# Patient Record
Sex: Female | Born: 1955 | Race: White | Hispanic: No | Marital: Single | Smoking: Never smoker
Health system: Southern US, Community
[De-identification: ages and names within clinical notes are randomized; demographics above are authoritative.]

## PROBLEM LIST (undated history)

## (undated) DIAGNOSIS — K219 Gastro-esophageal reflux disease without esophagitis: Secondary | ICD-10-CM

## (undated) DIAGNOSIS — J45909 Unspecified asthma, uncomplicated: Secondary | ICD-10-CM

## (undated) DIAGNOSIS — M1990 Unspecified osteoarthritis, unspecified site: Secondary | ICD-10-CM

## (undated) HISTORY — PX: OTHER SURGICAL HISTORY: SHX169

## (undated) HISTORY — PX: TONSILLECTOMY: SUR1361

## (undated) HISTORY — PX: APPENDECTOMY: SHX54

## (undated) HISTORY — PX: ANKLE FRACTURE SURGERY: SHX122

## (undated) HISTORY — PX: BILATERAL TEMPOROMANDIBULAR JOINT ARTHROPLASTY: SUR77

---

## 2005-04-16 ENCOUNTER — Emergency Department (HOSPITAL_COMMUNITY): Admission: EM | Admit: 2005-04-16 | Discharge: 2005-04-16 | Payer: Self-pay | Admitting: Emergency Medicine

## 2005-12-07 ENCOUNTER — Ambulatory Visit (HOSPITAL_COMMUNITY): Admission: RE | Admit: 2005-12-07 | Discharge: 2005-12-07 | Payer: Self-pay | Admitting: Obstetrics and Gynecology

## 2006-04-04 ENCOUNTER — Emergency Department (HOSPITAL_COMMUNITY): Admission: EM | Admit: 2006-04-04 | Discharge: 2006-04-04 | Payer: Self-pay | Admitting: Emergency Medicine

## 2006-08-05 ENCOUNTER — Emergency Department (HOSPITAL_COMMUNITY): Admission: EM | Admit: 2006-08-05 | Discharge: 2006-08-05 | Payer: Self-pay | Admitting: Emergency Medicine

## 2006-12-13 ENCOUNTER — Ambulatory Visit (HOSPITAL_COMMUNITY): Admission: RE | Admit: 2006-12-13 | Discharge: 2006-12-13 | Payer: Self-pay | Admitting: Family Medicine

## 2008-01-10 ENCOUNTER — Emergency Department (HOSPITAL_COMMUNITY): Admission: EM | Admit: 2008-01-10 | Discharge: 2008-01-10 | Payer: Self-pay | Admitting: Emergency Medicine

## 2008-01-20 ENCOUNTER — Ambulatory Visit (HOSPITAL_COMMUNITY): Admission: RE | Admit: 2008-01-20 | Discharge: 2008-01-20 | Payer: Self-pay | Admitting: Obstetrics & Gynecology

## 2008-06-05 IMAGING — CR DG CHEST 2V
2 series · 2 of 2 positions shown · non-contrast
Comparison: none

CLINICAL DATA: Left upper chest and back pain.  Shortness of breath.  History of spontaneous pneumothorax approximately 13 years ago.
CHEST ? 2 VIEW:

[view not recorded (1 of 2)]
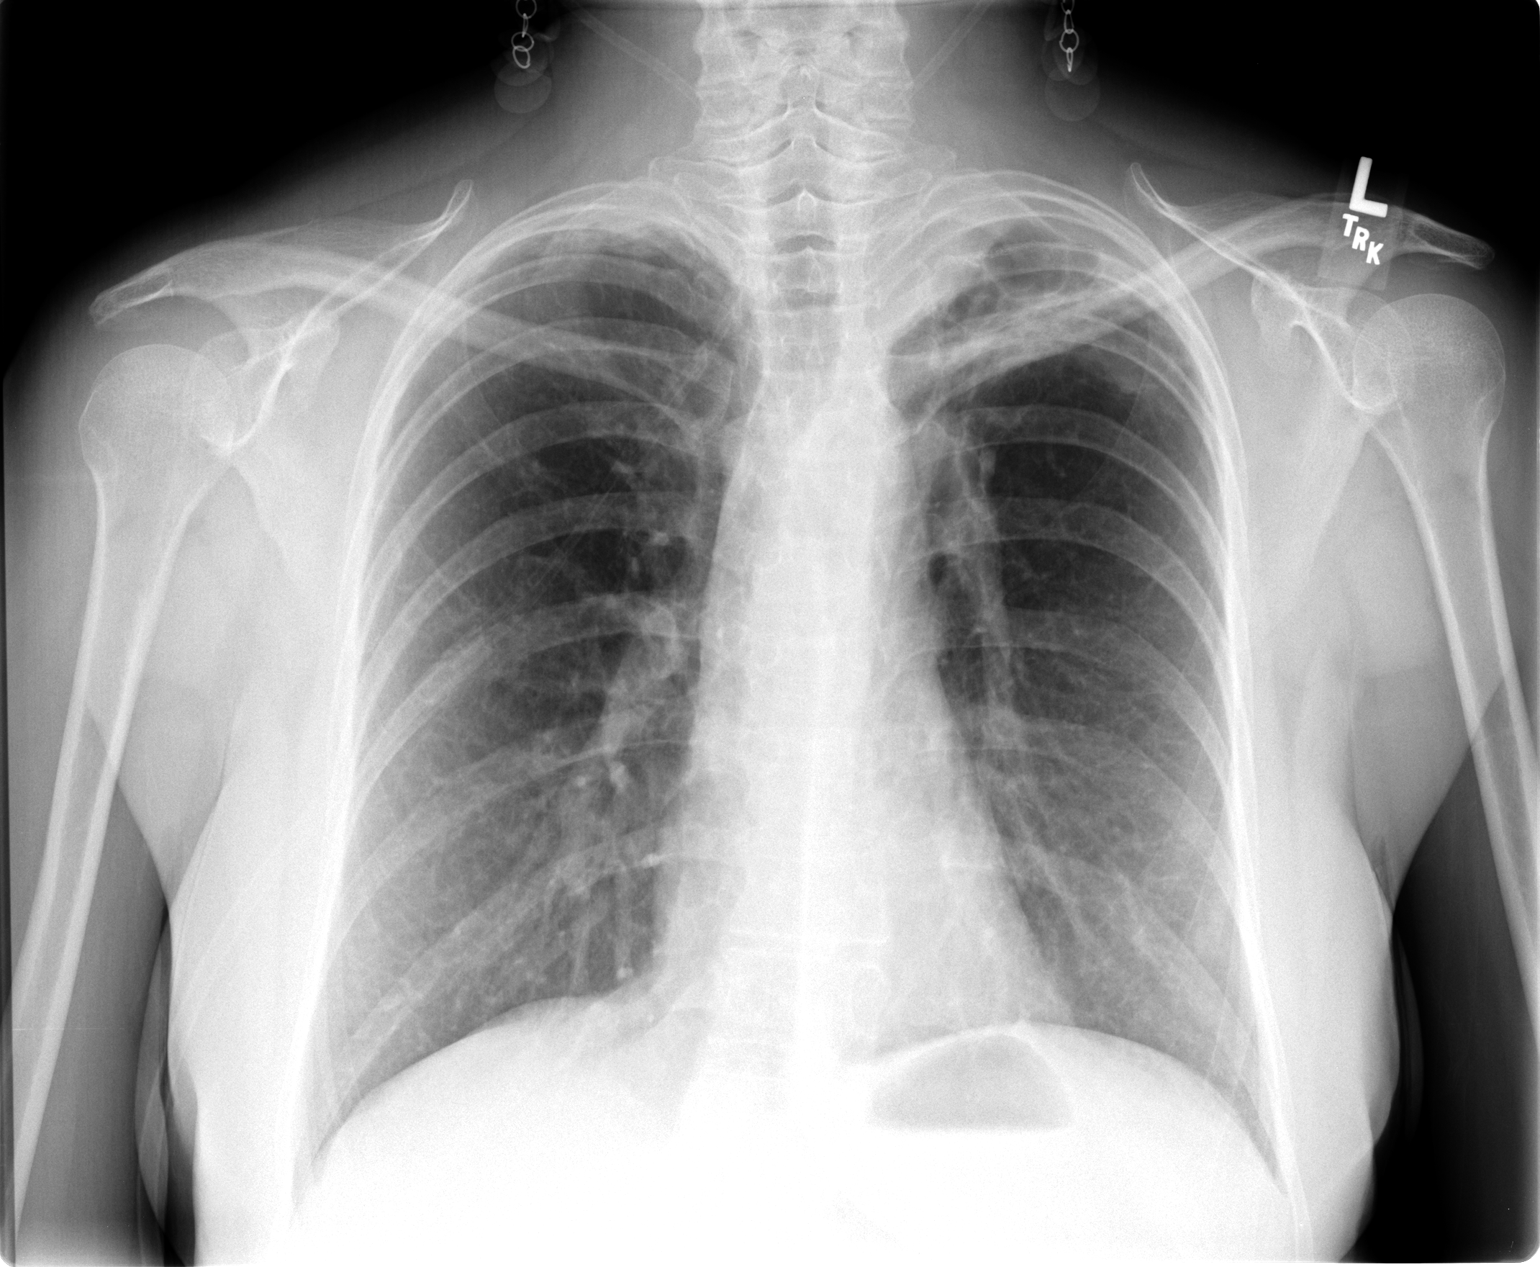

[view not recorded (2 of 2)]
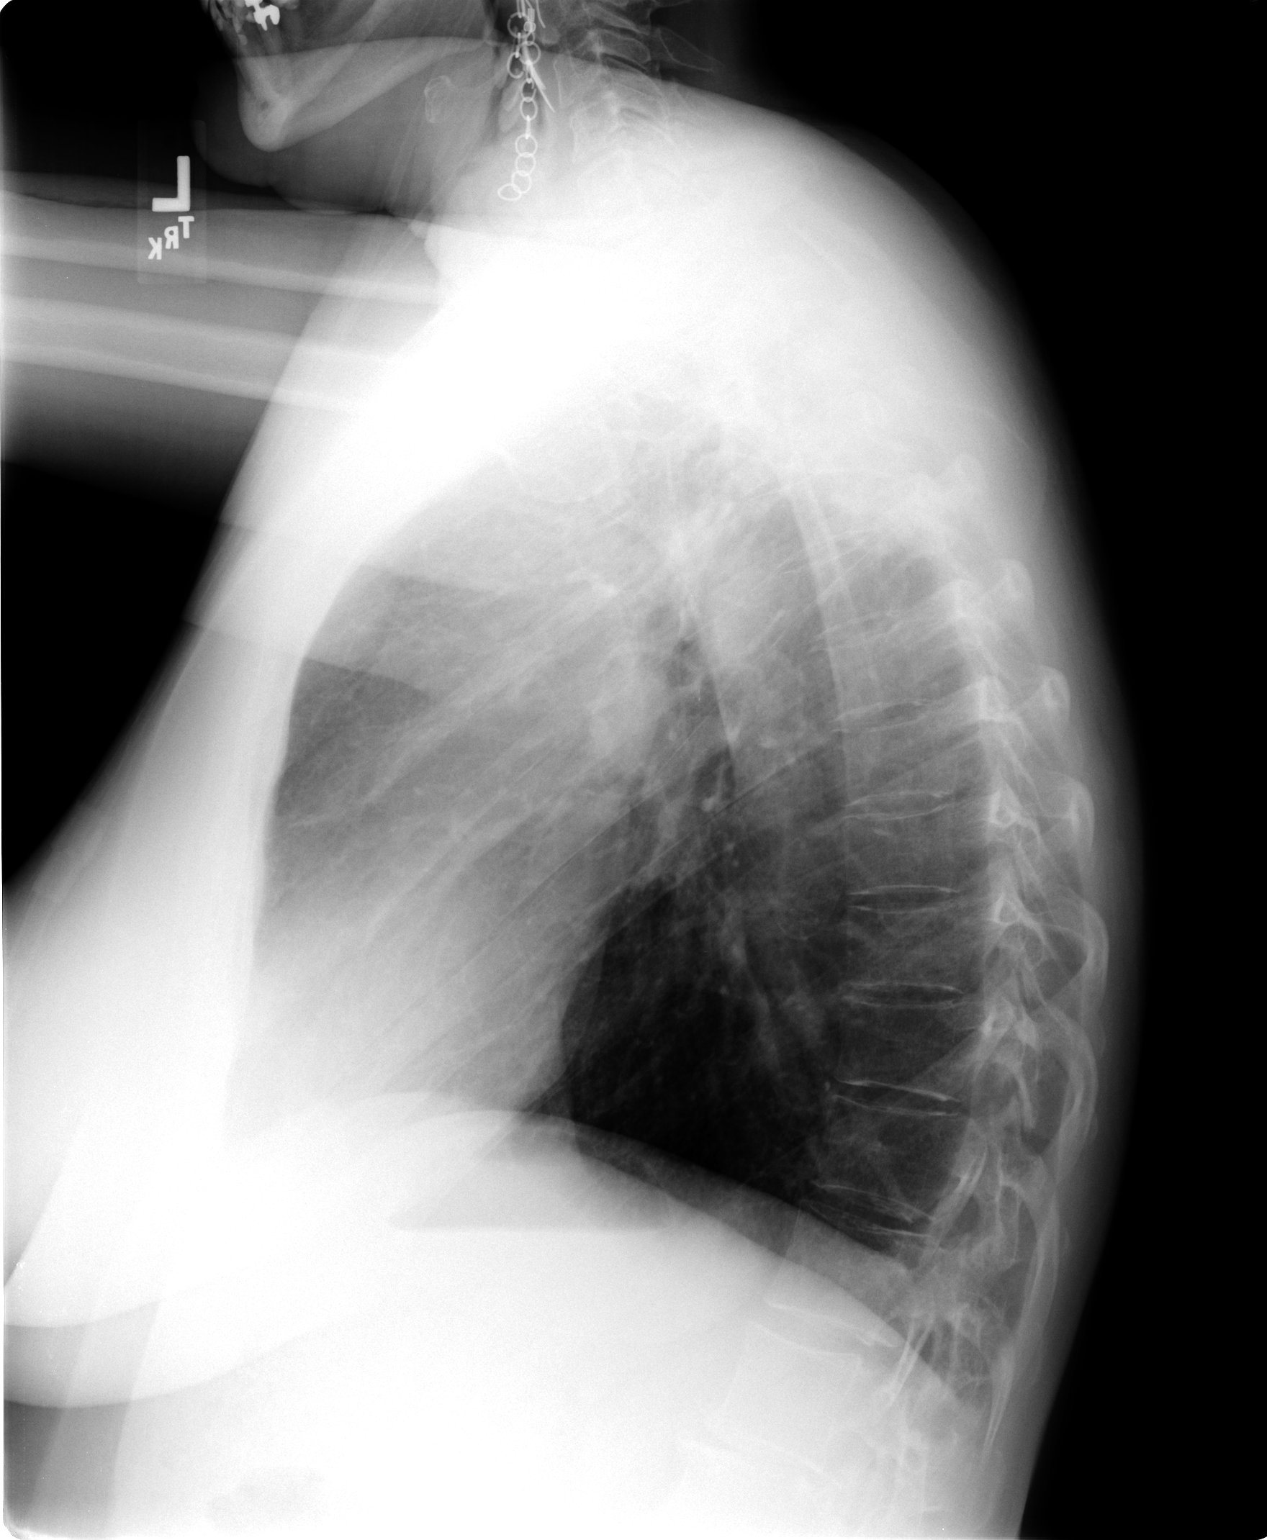

[2 of 2 positions shown; findings below may reference images not displayed]

FINDINGS: The patient has biapical pleuroparenchymal scarring, worse on the left.  There is a small cystic cavity in the left upper lobe with suture material identified.  This likely relates to the patient?s history of prior pneumothorax.  Lungs are otherwise clear.  No effusion or focal bony abnormality.  Heart size is normal.
IMPRESSION: No acute finding.  Biapical pleuroparenchymal scarring, worse on the left as noted.  As above, this likely relates to history of pneumothorax.

## 2008-08-15 ENCOUNTER — Emergency Department (HOSPITAL_COMMUNITY): Admission: EM | Admit: 2008-08-15 | Discharge: 2008-08-15 | Payer: Self-pay | Admitting: Emergency Medicine

## 2009-10-13 ENCOUNTER — Emergency Department (HOSPITAL_COMMUNITY): Admission: EM | Admit: 2009-10-13 | Discharge: 2009-10-13 | Payer: Self-pay | Admitting: Emergency Medicine

## 2010-10-25 LAB — CBC
HCT: 42.6 % (ref 36.0–46.0)
MCHC: 35.3 g/dL (ref 30.0–36.0)
RBC: 4.3 MIL/uL (ref 3.87–5.11)
RDW: 13 % (ref 11.5–15.5)
WBC: 7.7 10*3/uL (ref 4.0–10.5)

## 2010-10-25 LAB — BASIC METABOLIC PANEL
BUN: 10 mg/dL (ref 6–23)
Calcium: 9.9 mg/dL (ref 8.4–10.5)
Chloride: 100 mEq/L (ref 96–112)
Creatinine, Ser: 1.07 mg/dL (ref 0.4–1.2)
GFR calc non Af Amer: 54 mL/min — ABNORMAL LOW (ref >60)
Sodium: 140 mEq/L (ref 135–145)

## 2010-10-25 LAB — POCT CARDIAC MARKERS

## 2010-10-25 LAB — DIFFERENTIAL
Eosinophils Relative: 3 % (ref 0–5)
Monocytes Absolute: 0.7 10*3/uL (ref 0.1–1.0)
Neutro Abs: 4.9 10*3/uL (ref 1.7–7.7)

## 2012-01-09 ENCOUNTER — Other Ambulatory Visit (HOSPITAL_BASED_OUTPATIENT_CLINIC_OR_DEPARTMENT_OTHER): Payer: Self-pay | Admitting: Radiology

## 2012-01-09 ENCOUNTER — Other Ambulatory Visit: Payer: Self-pay | Admitting: Geriatric Medicine

## 2012-01-09 DIAGNOSIS — Z1231 Encounter for screening mammogram for malignant neoplasm of breast: Secondary | ICD-10-CM

## 2012-01-17 ENCOUNTER — Ambulatory Visit
Admission: RE | Admit: 2012-01-17 | Discharge: 2012-01-17 | Disposition: A | Discharge status: Home / Self Care | Payer: No Typology Code available for payment source | Source: Ambulatory Visit | Attending: Geriatric Medicine | Admitting: Geriatric Medicine

## 2012-01-17 DIAGNOSIS — Z1231 Encounter for screening mammogram for malignant neoplasm of breast: Secondary | ICD-10-CM

## 2013-01-16 ENCOUNTER — Emergency Department (HOSPITAL_COMMUNITY): Payer: Self-pay

## 2013-01-16 ENCOUNTER — Encounter (HOSPITAL_COMMUNITY): Payer: Self-pay | Admitting: Cardiology

## 2013-01-16 ENCOUNTER — Emergency Department (HOSPITAL_COMMUNITY)
Admission: EM | Admit: 2013-01-16 | Discharge: 2013-01-16 | Disposition: A | Discharge status: Home / Self Care | Payer: Self-pay | Attending: Emergency Medicine | Admitting: Emergency Medicine

## 2013-01-16 DIAGNOSIS — R071 Chest pain on breathing: Secondary | ICD-10-CM | POA: Insufficient documentation

## 2013-01-16 DIAGNOSIS — R079 Chest pain, unspecified: Secondary | ICD-10-CM

## 2013-01-16 DIAGNOSIS — R0602 Shortness of breath: Secondary | ICD-10-CM | POA: Insufficient documentation

## 2013-01-16 LAB — BASIC METABOLIC PANEL
BUN: 11 mg/dL (ref 6–23)
Calcium: 9.8 mg/dL (ref 8.4–10.5)
Chloride: 102 mEq/L (ref 96–112)
Creatinine, Ser: 0.72 mg/dL (ref 0.50–1.10)
GFR calc Af Amer: >90 mL/min (ref >90)
Glucose, Bld: 118 mg/dL — ABNORMAL HIGH (ref 70–99)
Potassium: 3.7 mEq/L (ref 3.5–5.1)

## 2013-01-16 LAB — CBC
Hemoglobin: 14.5 g/dL (ref 12.0–15.0)
MCHC: 34.8 g/dL (ref 30.0–36.0)
MCV: 97.4 fL (ref 78.0–100.0)
WBC: 8.9 10*3/uL (ref 4.0–10.5)

## 2013-01-16 MED ORDER — ONDANSETRON HCL 4 MG/2ML IJ SOLN
4.0000 mg | Freq: Once | INTRAMUSCULAR | Status: AC
  Administered 2013-01-16: 4 mg via INTRAVENOUS
  Filled 2013-01-16: qty 2

## 2013-01-16 MED ORDER — HYDROCHLOROTHIAZIDE 12.5 MG PO TABS: 25.0000 mg | ORAL_TABLET | Freq: Every day | ORAL | Status: DC

## 2013-01-16 MED ORDER — HYDROMORPHONE HCL PF 1 MG/ML IJ SOLN
1.0000 mg | Freq: Once | INTRAMUSCULAR | Status: AC
  Administered 2013-01-16: 1 mg via INTRAVENOUS
  Filled 2013-01-16: qty 1

## 2013-01-16 MED ORDER — IOHEXOL 350 MG/ML SOLN: 100.0000 mL | Freq: Once | INTRAVENOUS | Status: DC | PRN

## 2013-01-16 MED ORDER — OXYCODONE-ACETAMINOPHEN 5-325 MG PO TABS: 1.0000 | ORAL_TABLET | ORAL | Status: DC | PRN

## 2013-01-16 MED ORDER — IOHEXOL 350 MG/ML SOLN
100.0000 mL | Freq: Once | INTRAVENOUS | Status: AC | PRN
  Administered 2013-01-16: 60 mL via INTRAVENOUS

## 2013-01-16 MED ORDER — MORPHINE SULFATE 4 MG/ML IJ SOLN
4.0000 mg | Freq: Once | INTRAMUSCULAR | Status: AC
  Administered 2013-01-16: 4 mg via INTRAVENOUS
  Filled 2013-01-16: qty 1

## 2013-01-16 NOTE — ED Notes (Signed)
IV team paged.  

## 2013-01-16 NOTE — ED Notes (Signed)
Pt back from radiology, pt reports IV blew and was unable to have the CT angio performed. Pt requests IV team be contacted.

## 2013-01-16 NOTE — ED Notes (Signed)
Iv team called and said they will be here shortly.

## 2013-01-16 NOTE — ED Provider Notes (Signed)
History    CSN: [id number] Arrival date & time 01/16/13  1314  First MD Initiated Contact with Patient 01/16/13 1403     Chief Complaint  Patient presents with  . Chest Pain   (Consider location/radiation/quality/duration/timing/severity/associated sxs/prior Treatment) HPI Comments: Patient is a 57 year old female with a past medical history of previous PE, previous pneumothorax, and previous thoracentesis who presents with a 6 day history of left sided chest pain. Symptoms started gradually and progressively worsened since the onset. The pain is sharp and severe and radiates down the left side of her chest. She reports associated SOB. Patient came to the ED today because the pain became unbearable. No aggravating/alleviating factors. No other associated symptoms.   History reviewed. No pertinent past medical history. Past Surgical History  Procedure Laterality Date  . Fluid removed from lung     History reviewed. No pertinent family history. History  Substance Use Topics  . Smoking status: Never Smoker   . Smokeless tobacco: Not on file  . Alcohol Use: Yes   OB History   Grav Para Term Preterm Abortions TAB SAB Ect Mult Living                 Review of Systems  Respiratory: Positive for shortness of breath.   Cardiovascular: Positive for chest pain.  All other systems reviewed and are negative.    Allergies  Review of patient's allergies indicates no known allergies.  Home Medications  No current outpatient prescriptions on file. BP 139/81  Pulse 109  Temp(Src) 98.3 F (36.8 C) (Oral)  Resp 20 Physical Exam  Nursing note and vitals reviewed. Constitutional: She is oriented to person, place, and time. She appears well-developed and well-nourished.  Patient uncomfortable.   HENT:  Head: Normocephalic and atraumatic.  Eyes: Conjunctivae and EOM are normal.  Neck: Normal range of motion.  Cardiovascular: Normal rate and regular rhythm.  Exam reveals no gallop  and no friction rub.   No murmur heard. Pulmonary/Chest: Effort normal and breath sounds normal. She has no wheezes. She has no rales. She exhibits no tenderness.  Abdominal: Soft. She exhibits no distension. There is no tenderness. There is no rebound and no guarding.  Musculoskeletal: Normal range of motion.  Neurological: She is alert and oriented to person, place, and time. Coordination normal.  Speech is goal-oriented. Moves limbs without ataxia.   Skin: Skin is warm and dry.  Psychiatric: She has a normal mood and affect. Her behavior is normal.    ED Course  Procedures (including critical care time)   Date: 01/16/2013  Rate: 107  Rhythm: sinus tachycardia  QRS Axis: normal  Intervals: normal  ST/T Wave abnormalities: normal  Conduction Disutrbances:none  Narrative Interpretation: sinus tachycardia without previous for comparison  Old EKG Reviewed: none available    Labs Reviewed  BASIC METABOLIC PANEL - Abnormal; Notable for the following:    Glucose, Bld 118 (*)    All other components within normal limits  PRO B NATRIURETIC PEPTIDE - Abnormal; Notable for the following:    Pro B Natriuretic peptide (BNP) 224.0 (*)    All other components within normal limits  CBC  POCT I-STAT TROPONIN I   Dg Chest 2 View  01/16/2013   *RADIOLOGY REPORT*  Clinical Data: Chest pain  CHEST - 2 VIEW  Comparison: August 15, 2008  Findings:  There is stable fibrotic change with scarring in both apices, considerably more on the left than on the right.  Overall there  is no edema or consolidation.  Heart size is normal.  The pulmonary vascularity is stable with some retraction superiorly of the pulmonary vasculature on the left due to the scarring.  No adenopathy.  No bone lesions.  IMPRESSION: Stable scarring in the apices, considerably more on the left than on the right.  No edema or consolidation.   Original Report Authenticated By: Bretta Bang, M.D.   Ct Angio Chest Pe W/cm &/or Wo  Cm  01/16/2013   *RADIOLOGY REPORT*  Clinical Data: Left chest pain with increase shortness of breath. Question pulmonary embolism.  History of pneumothorax.  CT ANGIOGRAPHY CHEST  Technique:  Multidetector CT imaging of the chest using the standard protocol during bolus administration of intravenous contrast. Multiplanar reconstructed images including MIPs were obtained and reviewed to evaluate the vascular anatomy.  Contrast: 60mL OMNIPAQUE IOHEXOL 350 MG/ML SOLN  Comparison: Chest radiographs 01/16/2013 and 08/15/2008.  No prior CT.  Findings: The pulmonary arteries are well opacified with contrast. There is no evidence of acute pulmonary embolism.  No significant atherosclerosis is demonstrated.  There are no enlarged mediastinal or hilar lymph nodes.  There is no pleural or pericardial effusion.  Left apical pleural thickening appears grossly unchanged from the prior radiographs.  There are surgical clips at the left apex with bronchiectasis and a 2.8 cm apical bleb or chronic loculated pneumothorax. Mild right apical scarring is present.  There is no confluent airspace opacity.  The visualized upper abdomen is notable for scarring in the upper pole of the right kidney adjacent to a calyceal calculus.  There are no acute osseous findings.  IMPRESSION:  1.  No evidence of acute pulmonary embolism or other acute chest process. 2.  Left apical pleural thickening with adjacent scarring, bronchiectasis and bleb or loculated pneumothorax, unchanged from prior radiographs.   Original Report Authenticated By: Carey Bullocks, M.D.   1. Chest pain     MDM  2:29 PM Labs pending. Chest xray unremarkable for acute changes. Patient tachycardic. Patient will have morphine and zofran for symptoms.   3:11 PM Patient will have Chest CT to rule out PE. Patient signed out to Sharilyn Sites, PA-C pending disposition.     Emilia Beck, PA-C 01/17/13 1109

## 2013-01-16 NOTE — ED Notes (Signed)
PA at bedside.

## 2013-01-16 NOTE — ED Notes (Signed)
CT called and informed pt has IV access.  

## 2013-01-16 NOTE — ED Notes (Signed)
Pt reports hx of a pneumothrorax about 2 years ago. Reports that since Friday she has been having pain on the left side. Reports increased SOB. Pt able to speak in complete sentences and breath sounds noted bilaterally.

## 2013-01-16 NOTE — ED Notes (Signed)
IV team at bedside 

## 2013-01-16 NOTE — ED Provider Notes (Signed)
Pt received from PA Szekalski at shift change.  Pt complaining of progressively worsening, left sided chest pain x 6 days with associated SOB.   Hx of PE and pneumothorax.  Pt awaiting CT angio to r/o PE.  Plan:  Follow results of scan, if negative-- d/c home.  Results for orders placed during the hospital encounter of 01/16/13  CBC      Result Value Range   WBC 8.9  4.0 - 10.5 K/uL   RBC 4.28  3.87 - 5.11 MIL/uL   Hemoglobin 14.5  12.0 - 15.0 g/dL   HCT 04.5  40.9 - 81.1 %   MCV 97.4  78.0 - 100.0 fL   MCH 33.9  26.0 - 34.0 pg   MCHC 34.8  30.0 - 36.0 g/dL   RDW 91.4  78.2 - 95.6 %   Platelets 221  150 - 400 K/uL  BASIC METABOLIC PANEL      Result Value Range   Sodium 140  135 - 145 mEq/L   Potassium 3.7  3.5 - 5.1 mEq/L   Chloride 102  96 - 112 mEq/L   CO2 22  19 - 32 mEq/L   Glucose, Bld 118 (*) 70 - 99 mg/dL   BUN 11  6 - 23 mg/dL   Creatinine, Ser 2.13  0.50 - 1.10 mg/dL   Calcium 9.8  8.4 - 08.6 mg/dL   GFR calc non Af Amer >90  >90 mL/min   GFR calc Af Amer >90  >90 mL/min  PRO B NATRIURETIC PEPTIDE      Result Value Range   Pro B Natriuretic peptide (BNP) 224.0 (*) 0 - 125 pg/mL  POCT I-STAT TROPONIN I      Result Value Range   Troponin i, poc 0.00  0.00 - 0.08 ng/mL   Comment 3            Dg Chest 2 View  01/16/2013   *RADIOLOGY REPORT*  Clinical Data: Chest pain  CHEST - 2 VIEW  Comparison: August 15, 2008  Findings:  There is stable fibrotic change with scarring in both apices, considerably more on the left than on the right.  Overall there is no edema or consolidation.  Heart size is normal.  The pulmonary vascularity is stable with some retraction superiorly of the pulmonary vasculature on the left due to the scarring.  No adenopathy.  No bone lesions.  IMPRESSION: Stable scarring in the apices, considerably more on the left than on the right.  No edema or consolidation.   Original Report Authenticated By: Bretta Bang, M.D.   Ct Angio Chest Pe W/cm &/or Wo  Cm  01/16/2013   *RADIOLOGY REPORT*  Clinical Data: Left chest pain with increase shortness of breath. Question pulmonary embolism.  History of pneumothorax.  CT ANGIOGRAPHY CHEST  Technique:  Multidetector CT imaging of the chest using the standard protocol during bolus administration of intravenous contrast. Multiplanar reconstructed images including MIPs were obtained and reviewed to evaluate the vascular anatomy.  Contrast: 60mL OMNIPAQUE IOHEXOL 350 MG/ML SOLN  Comparison: Chest radiographs 01/16/2013 and 08/15/2008.  No prior CT.  Findings: The pulmonary arteries are well opacified with contrast. There is no evidence of acute pulmonary embolism.  No significant atherosclerosis is demonstrated.  There are no enlarged mediastinal or hilar lymph nodes.  There is no pleural or pericardial effusion.  Left apical pleural thickening appears grossly unchanged from the prior radiographs.  There are surgical clips at the left apex with bronchiectasis and a  2.8 cm apical bleb or chronic loculated pneumothorax. Mild right apical scarring is present.  There is no confluent airspace opacity.  The visualized upper abdomen is notable for scarring in the upper pole of the right kidney adjacent to a calyceal calculus.  There are no acute osseous findings.  IMPRESSION:  1.  No evidence of acute pulmonary embolism or other acute chest process. 2.  Left apical pleural thickening with adjacent scarring, bronchiectasis and bleb or loculated pneumothorax, unchanged from prior radiographs.   Original Report Authenticated By: Carey Bullocks, M.D.    6:04 PM CT angio negative for PE.  Other findings unchanged from previous.  Pt re-evaluated, pain is starting to return-- additional meds given.  Pt also notes that she has been retaining fluid over the week, weight increased by 4-5 lbs which resolved by taking some leftover HCTZ.  7:20 PM Pain resolved with IV dilaudid and zofran.  Pt does not have a PCP at this time-- resource  guide given and encouraged FU.  Also given contact info for [facility] cardiology for FU if sx do not resolve.  Rx HCTZ and percocet.  Discussed plan with pt, she agreed.  Return precautions advised.  Garlon Hatchet, PA-C 01/16/13 2337

## 2013-01-16 NOTE — ED Notes (Signed)
Pt requested to speak to PA, PA informed. PA at bedside.

## 2013-01-18 NOTE — ED Provider Notes (Signed)
Medical screening examination/treatment/procedure(s) were performed by non-physician practitioner and as supervising physician I was immediately available for consultation/collaboration.  Javonne Dorko R. Ericka Marcellus, MD 01/18/13 1401 

## 2013-01-19 NOTE — ED Provider Notes (Signed)
Medical screening examination/treatment/procedure(s) were performed by non-physician practitioner and as supervising physician I was immediately available for consultation/collaboration.   Charles B. Bernette Mayers, MD 01/19/13 [id number]

## 2013-09-17 ENCOUNTER — Emergency Department (HOSPITAL_COMMUNITY)
Admission: EM | Admit: 2013-09-17 | Discharge: 2013-09-17 | Disposition: A | Discharge status: Home / Self Care | Payer: No Typology Code available for payment source | Attending: Emergency Medicine | Admitting: Emergency Medicine

## 2013-09-17 ENCOUNTER — Encounter (HOSPITAL_COMMUNITY): Payer: Self-pay | Admitting: Emergency Medicine

## 2013-09-17 ENCOUNTER — Emergency Department (HOSPITAL_COMMUNITY): Payer: No Typology Code available for payment source

## 2013-09-17 DIAGNOSIS — R51 Headache: Secondary | ICD-10-CM | POA: Insufficient documentation

## 2013-09-17 DIAGNOSIS — R251 Tremor, unspecified: Secondary | ICD-10-CM

## 2013-09-17 DIAGNOSIS — R259 Unspecified abnormal involuntary movements: Secondary | ICD-10-CM | POA: Insufficient documentation

## 2013-09-17 DIAGNOSIS — Y929 Unspecified place or not applicable: Secondary | ICD-10-CM | POA: Insufficient documentation

## 2013-09-17 DIAGNOSIS — Y9389 Activity, other specified: Secondary | ICD-10-CM | POA: Insufficient documentation

## 2013-09-17 DIAGNOSIS — R209 Unspecified disturbances of skin sensation: Secondary | ICD-10-CM | POA: Insufficient documentation

## 2013-09-17 DIAGNOSIS — W010XXA Fall on same level from slipping, tripping and stumbling without subsequent striking against object, initial encounter: Secondary | ICD-10-CM | POA: Insufficient documentation

## 2013-09-17 DIAGNOSIS — M791 Myalgia, unspecified site: Secondary | ICD-10-CM

## 2013-09-17 DIAGNOSIS — S0990XA Unspecified injury of head, initial encounter: Secondary | ICD-10-CM

## 2013-09-17 LAB — CBC WITH DIFFERENTIAL/PLATELET
BASOS ABS: 0 10*3/uL (ref 0.0–0.1)
BASOS PCT: 1 % (ref 0–1)
Eosinophils Absolute: 0 10*3/uL (ref 0.0–0.7)
Eosinophils Relative: 1 % (ref 0–5)
HEMATOCRIT: 41.7 % (ref 36.0–46.0)
Hemoglobin: 14.5 g/dL (ref 12.0–15.0)
Lymphocytes Relative: 21 % (ref 12–46)
Lymphs Abs: 1.7 10*3/uL (ref 0.7–4.0)
MCH: 36 pg — ABNORMAL HIGH (ref 26.0–34.0)
MCHC: 34.8 g/dL (ref 30.0–36.0)
MCV: 103.5 fL — ABNORMAL HIGH (ref 78.0–100.0)
MONO ABS: 0.5 10*3/uL (ref 0.1–1.0)
Monocytes Relative: 6 % (ref 3–12)
NEUTROS ABS: 6.1 10*3/uL (ref 1.7–7.7)
NEUTROS PCT: 72 % (ref 43–77)
PLATELETS: 223 10*3/uL (ref 150–400)
RBC: 4.03 MIL/uL (ref 3.87–5.11)
RDW: 12.3 % (ref 11.5–15.5)
WBC: 8.5 10*3/uL (ref 4.0–10.5)

## 2013-09-17 LAB — I-STAT CHEM 8, ED
BUN: 9 mg/dL (ref 6–23)
CALCIUM ION: 1.08 mmol/L — AB (ref 1.12–1.23)
CHLORIDE: 102 meq/L (ref 96–112)
Creatinine, Ser: 0.7 mg/dL (ref 0.50–1.10)
Glucose, Bld: 87 mg/dL (ref 70–99)
HEMATOCRIT: 46 % (ref 36.0–46.0)
Hemoglobin: 15.6 g/dL — ABNORMAL HIGH (ref 12.0–15.0)
Potassium: 3.9 mEq/L (ref 3.7–5.3)
SODIUM: 137 meq/L (ref 137–147)
TCO2: 20 mmol/L (ref 0–100)

## 2013-09-17 LAB — CK: CK TOTAL: 69 U/L (ref 7–177)

## 2013-09-17 MED ORDER — LORAZEPAM 1 MG PO TABS
1.0000 mg | ORAL_TABLET | Freq: Once | ORAL | Status: AC
Start: 2013-09-17 — End: 2013-09-17
  Administered 2013-09-17: 1 mg via ORAL
  Filled 2013-09-17: qty 1

## 2013-09-17 MED ORDER — OXYCODONE-ACETAMINOPHEN 5-325 MG PO TABS
1.0000 | ORAL_TABLET | Freq: Once | ORAL | Status: AC
  Administered 2013-09-17: 1 via ORAL
  Filled 2013-09-17: qty 1

## 2013-09-17 MED ORDER — HYDROCODONE-ACETAMINOPHEN 5-325 MG PO TABS
1.0000 | ORAL_TABLET | ORAL | Status: DC | PRN
Start: 2013-09-17 — End: 2021-12-15

## 2013-09-17 NOTE — Discharge Instructions (Signed)
Your CT scan of the head and cervical spine did not show any acute injury. Your blood work was normal. Take ibuprofen for pain. Norco for severe pain. Follow up with a primary care doctor for further testing.    Emergency Department Resource Guide 1) Find a Doctor and Pay Out of Pocket Although you won't have to find out who is covered by your insurance plan, it is a good idea to ask around and get recommendations. You will then need to call the office and see if the doctor you have chosen will accept you as a new patient and what types of options they offer for patients who are self-pay. Some doctors offer discounts or will set up payment plans for their patients who do not have insurance, but you will need to ask so you aren't surprised when you get to your appointment.  2) Contact Your Local Health Department Not all health departments have doctors that can see patients for sick visits, but many do, so it is worth a call to see if yours does. If you don't know where your local health department is, you can check in your phone book. The CDC also has a tool to help you locate your state's health department, and many state websites also have listings of all of their local health departments.  3) Find a Walk-in Clinic If your illness is not likely to be very severe or complicated, you may want to try a walk in clinic. These are popping up all over the country in pharmacies, drugstores, and shopping centers. They're usually staffed by nurse practitioners or physician assistants that have been trained to treat common illnesses and complaints. They're usually fairly quick and inexpensive. However, if you have serious medical issues or chronic medical problems, these are probably not your best option.  No Primary Care Doctor: - Call Health Connect at  [phone] - they can help you locate a primary care doctor that  accepts your insurance, provides certain services, etc. - Physician Referral Service-  [phone]  Chronic Pain Problems: Organization         Address  Phone   Notes  [facility]  [phone] Patients need to be referred by their primary care doctor.   Medication Assistance: Organization         Address  Phone   Notes  [facility] Medication [facility] [address] [place], [place] [address] [phone] --Must be a resident of [place] [facility] -- Must have NO insurance coverage whatsoever (no Medicaid/ Medicare, etc.) -- The pt. MUST have a primary care doctor that directs their care regularly and follows them in the community   MedAssist  [phone]   [employer]  [phone]    Agencies that provide inexpensive medical care: Organization         Address  Phone   Notes  [facility]  [phone]) [phone]   [facility]    [phone]   [facility] [address] [place], [place] [address] [phone]   [facility] [address], [place] [phone]   Planned Parenthood    [phone]   [facility]    [phone]   Community Health and [facility]  [address], [place] Phone:  [phone], Fax:  [phone] Hours of Operation:  9 am - 6 pm, M-F.  Also accepts Medicaid/Medicare and self-pay.  [facility] for [place] [address], [address], [place] Phone: [phone], Fax: [phone]. Hours of Operation:  8:30 am - 5:30 pm, M-F.  Also accepts Medicaid and self-pay.  [facility] [place] [address] [place] Phone: [phone]   [place], [place], [place] [phone], Ext. [phone] Mondays & Thursdays: 7-9 AM.  First 15 patients are seen on a first come, first serve basis.    [place] Providers:  Organization         Address  Phone   Notes  [facility] [address],  [place] [phone] Also accepts self-pay patients.  [facility] [id number] [place], [place]  [phone]   [place], [address], [place] [phone]   [facility] [address] [place] [phone]   Lucianne Lei [address], [place]   [phone] Only accepts [place] Access [place] patients after they have their name applied to their card.   Self-Pay (no insurance) in [facility]:  Organization         Address  Phone   Notes  Sickle Cell Patients, [facility] [place] [phone]   [facility] [place] [phone]   [facility] [place]  [place], [place], [place] [phone]   [facility]/Dr. Osei-Bonsu  [address] [place] [address] [place] or [address], [place] [phone] Phone number for both [place] and [place] locations is the same.  Urgent Medical and [facility] [address], [place] [phone]   [facility] [address] [place] or [address] [phone]   [facility] [address], [place] [phone], phone; [phone], fax Sees patients 1st and 3rd Saturday of every month.  Must not qualify for public or private insurance (i.e. Medicaid, Medicare, [place] Health Choice, Veterans' Benefits)  Household income should be no more than 200% of the poverty level The clinic cannot treat you if you are pregnant or think you are pregnant  Sexually transmitted diseases are not treated at the clinic.    Dental Care: Organization         Address  Phone  Notes  [facility] of [facility] [place] [phone] Accepts children up to age 81 who are enrolled in  [place] or [place]; pregnant women with a Medicaid card; and children who have applied for Medicaid or  Health Choice, but were declined, whose parents can pay a reduced fee at time of service.  [facility] of [facility]  [address], [place] [phone] Accepts children up to age 44 who are enrolled in [place] or [place]; pregnant women with a Medicaid card; and children who have applied for Medicaid or  Health Choice, but were declined, whose parents can pay a reduced fee at time of service.  [facility]  [place] [phone] Patients are seen by appointment only. Walk-ins are not accepted. [place] will see patients 52 years of age and older. Monday - Tuesday (8am-5pm) Most Wednesdays (8:30-5pm) $30 per  visit, cash only  [facility] Adult [employer] PROGRAM  [address], [address] [phone] Patients are seen by appointment only. Walk-ins are not accepted. [place] will see patients 48 years of age and older. One Wednesday Evening (Monthly: Volunteer Based).  $30 per visit, cash only  [place]  [phone] for adults; Children under age 62, call Graduate Pediatric Dentistry at [phone]. Children aged 41-14, please call [phone] to request a pediatric application.  Dental services are provided in all areas of dental care including fillings, crowns and bridges, complete and partial dentures, implants, gum treatment, root canals, and extractions. Preventive care is also provided. Treatment is provided to both adults and children. Patients are selected via a lottery and there is often a waiting list.   [facility] [address] [place]  [phone] [email or web address]   [facility] [address] [place], [place] [phone], Ext. [phone] Second and Fourth Thursday of each month, opens at 6:30  AM; Clinic ends at 9 AM.  Patients are seen on a first-come first-served basis, and a limited number are seen during each clinic.   [facility]  [address] Hillard Danker [place], [place] [phone]   Eligibility Requirements You must have lived in [place], [place], or [place] counties for at least the last three months.   You cannot be eligible for state or federal sponsored [employer], including [employer], [place], or [employer].   You generally cannot be eligible for healthcare insurance through your employer.    How to apply: Eligibility screenings are held every Tuesday and Wednesday afternoon from 1:00 pm until 4:00 pm. You do not need an appointment for the interview!  [facility] [address], [place], [place]   [place]  [place] Department  [place]  [phone]    Behavioral Health Resources in the Community: Intensive Outpatient Programs Organization         Address  Phone  Notes  Ecru Dyer. [address] [place], [place] [phone]   [facility] Outpatient [address] [place], [place]   ADS: Alcohol & Drug Svcs [address] [place], [place]   [place] [address]  [place], [place] or [phone]   Substance Abuse Resources Organization         Address  Phone  Notes  Alcohol and Drug Services  [phone]   [place]  [phone]   The [place]   Chinita Pester  [phone]   Residential & Outpatient Substance Abuse Program  [phone]   Psychological Services Organization         Address  Phone  Notes  [facility] [place]  [place]  [phone]   [place] [address] [place] or  [phone]    Mobile Crisis Teams Organization         Address  Phone  Notes  [facility], Mobile Crisis Care Unit  [phone]   [facility]  [address] [place], [place]   [place] Levels [address], [place] [phone]    Self-Help/Support Groups Organization         Address  Phone  Notes  [facility] [place] - variety of support groups  [place] Call for more information  Narcotics Anonymous (NA), [facility] [address], [employer] [place]  2 meetings at this location   Special educational needs teacher         Address  Phone  Notes  AS[facility] Residential Treatment [place],    [place]  [phone]   [facility]  [address] [place] [id number], [place], [place]   [place], [place] [phone] Admissions: 8am-3pm M-F  Incentives Substance [place] [address]    [place], [place] [phone]   [facility] [address] [place], [place], [place]   [facility] [address],  [place], [place]   [facility] Programs - Intensive Outpatient [address] Kristeen [address], [place], [place]   [facility] ([place] [address].,  [place], [place] [phone] or [phone]   [facility]idential Treatment Services (RTS) [address] [place], [place] Accepts Medicaid  [facility] [place] [address],  [place] [phone] Substance Abuse/Addiction Treatment   [facility] Organization         Address  Phone  Notes  [facility]  [phone]   Domenic Schwab, PhD [address] Arlis Porta [place], [place]   [phone] or [phone]   [place] and [place], [place] [phone]   [facility] [address],  [place], [place] [phone] Insurance/Medicaid/sponsorship through [facility] and Families [address], [address] [place]                                    [place], [place] [phone] [place] [address] [place], [place] [phone]    Dr. Adele Schilder  [phone]   [facility] Dept. 1) [address], [place] 2) [place] 3)  [place] [address], [place] [phone]  [phone]   [place] [phone] or [phone] (After Hours)

## 2013-09-17 NOTE — ED Notes (Signed)
Pt is insistent that she needs to have her entire body x-rayed. PA Lemont Fillersatyana is at the bedside and is trying to explain to pt that her symptoms are not going to be explained by x-ray, however pt is not understanding and is repeating that we are not showing her enough respect.

## 2013-09-17 NOTE — ED Notes (Signed)
Pt alert, arrives from home, c/o pain all over, onset several days ago after fall on ice, no obvious injury noted, resp even unlabored, skin pwd
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
## 2013-09-17 NOTE — ED Provider Notes (Signed)
Medical screening examination/treatment/procedure(s) were performed by non-physician practitioner and as supervising physician I was immediately available for consultation/collaboration.   EKG Interpretation None       Ethelda ChickMartha K Linker, MD 09/17/13 [phone]
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
## 2020-01-07 ENCOUNTER — Ambulatory Visit: Payer: No Typology Code available for payment source | Admitting: Orthopaedic Surgery

## 2020-01-26 ENCOUNTER — Ambulatory Visit: Payer: No Typology Code available for payment source | Admitting: Orthopaedic Surgery

## 2020-02-04 ENCOUNTER — Other Ambulatory Visit: Payer: Self-pay

## 2020-02-04 ENCOUNTER — Ambulatory Visit (INDEPENDENT_AMBULATORY_CARE_PROVIDER_SITE_OTHER): Payer: Self-pay

## 2020-02-04 ENCOUNTER — Ambulatory Visit (INDEPENDENT_AMBULATORY_CARE_PROVIDER_SITE_OTHER): Payer: Self-pay | Admitting: Orthopaedic Surgery

## 2020-02-04 VITALS — Ht 63.0 in | Wt 140.0 lb

## 2020-02-04 DIAGNOSIS — M25552 Pain in left hip: Secondary | ICD-10-CM

## 2020-02-04 DIAGNOSIS — M87052 Idiopathic aseptic necrosis of left femur: Secondary | ICD-10-CM

## 2020-02-04 NOTE — Progress Notes (Signed)
Office Visit Note   Patient: Heidi [name]           Date of Birth: [birth year]           MRN: [id number] Visit Date: 02/04/2020              Requested by: No referring provider defined for this encounter. PCP: Patient, No Pcp Per   Assessment & Plan: Visit Diagnoses:  1. Pain in left hip   2. Avascular necrosis of bone of left hip (HCC)     Plan: The patient is in need of an urgent hip replacement given the severity of her deformity of her left hip with avascular necrosis showing cystic changes and femoral head collapse.  I explained in detail what hip replacement surgery involves.  We have given her information to give to [facility] to get involved in the [facility] discount program because I feel it is necessary we get her set up for surgery as soon as we can.  All question concerns were answered and addressed.  Patient is we get some clearance for surgery from [facility] we will proceed with setting her up for a left total hip arthroplasty.  Follow-Up Instructions: No follow-ups on file.   Orders:  Orders Placed This Encounter  Procedures  . XR HIP UNILAT W OR W/O PELVIS 1V LEFT   No orders of the defined types were placed in this encounter.     Procedures: No procedures performed   Clinical Data: No additional findings.   Subjective: Chief Complaint  Patient presents with  . Left Hip - Pain  The patient is someone seeing for the first time.  She has been dealing with worsening severe left hip pain for last 2 and half years that is getting significantly worse acutely over the last several weeks.  She ambulates with a rolling walker.  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  She has no known injury but due to scoliosis and rheumatoid disease she has been on and off steroids for many years now.  This is likely the source of the avascular process of her left hip.  Her pain is severe and daily.  She is a  fall risk at this point.  At this point her left hip is definitely affecting her mobility, her quality of life and her actives daily living.  She is in need of a more urgent hip replacement at this standpoint.  She has been trying to apply for Medicaid disability.  HPI  Review of Systems There is currently no chest pain or shortness of breath.  There is no fever, chills, nausea, vomiting  Objective: Vital Signs: Ht 5\' 3"  (1.6 m)   Wt 140 lb (63.5 kg)   BMI 24.80 kg/m   Physical Exam She is alert and oriented and in no acute distress but obvious discomfort. Ortho Exam Examination of her left hip shows severe pain with any attempts of rotation. Specialty Comments:  No specialty comments available.  Imaging: XR HIP UNILAT W OR W/O PELVIS 1V LEFT  Result Date: 02/04/2020 An AP pelvis and lateral of the left hip shows end-stage avascular necrosis with femoral head collapse of the left femoral head.  There is cystic change in the femoral head as well.  The right hip itself appears normal but the left hip is severely deformed.    PMFS History: Patient Active Problem List  Diagnosis Date Noted  . Avascular necrosis of bone of left hip (HCC) 02/04/2020   No past medical history on file.  No family history on file.  Past Surgical History:  Procedure Laterality Date  . APPENDECTOMY    . BILATERAL TEMPOROMANDIBULAR JOINT ARTHROPLASTY    . Fluid removed from lung     Social History   Occupational History  . Not on file  Tobacco Use  . Smoking status: Never Smoker  Substance and Sexual Activity  . Alcohol use: Yes  . Drug use: No  . Sexual activity: Not on file

## 2020-05-12 ENCOUNTER — Telehealth: Payer: Self-pay | Admitting: Orthopaedic Surgery

## 2020-05-12 NOTE — Telephone Encounter (Signed)
Received vm from pt inquiring if we received request for records from [employer]. IC,lmvm advised we have not received . I advised she reach out to her case worker, I left our fax number and mailing address fo which request can be sent to.

## 2020-05-24 ENCOUNTER — Telehealth: Payer: Self-pay | Admitting: Orthopaedic Surgery

## 2020-05-24 NOTE — Telephone Encounter (Signed)
Received vm from patient wanting copy of records. IC,lmvm advised need to come in and sign authorization to release.

## 2021-12-15 ENCOUNTER — Ambulatory Visit: Payer: Self-pay

## 2021-12-15 ENCOUNTER — Encounter: Payer: Self-pay | Admitting: Physician Assistant

## 2021-12-15 ENCOUNTER — Ambulatory Visit (INDEPENDENT_AMBULATORY_CARE_PROVIDER_SITE_OTHER): Payer: PPO

## 2021-12-15 ENCOUNTER — Ambulatory Visit (INDEPENDENT_AMBULATORY_CARE_PROVIDER_SITE_OTHER): Payer: Self-pay | Admitting: Physician Assistant

## 2021-12-15 VITALS — Ht 64.0 in | Wt 141.6 lb

## 2021-12-15 DIAGNOSIS — M87051 Idiopathic aseptic necrosis of right femur: Secondary | ICD-10-CM

## 2021-12-15 DIAGNOSIS — M87052 Idiopathic aseptic necrosis of left femur: Secondary | ICD-10-CM

## 2021-12-15 NOTE — Progress Notes (Signed)
Office Visit Note   Patient: Heidi [name]           Date of Birth: Sep 06, 1955           MRN: [id number] Visit Date: 12/15/2021              Requested by: No referring provider defined for this encounter. PCP: Patient, No Pcp Per (Inactive)   Assessment & Plan: Visit Diagnoses:  1. Avascular necrosis of bone of hip, right ([place])   2. Avascular necrosis of bone of left hip (HCC)     Plan: Given patient's radiographic findings and significant pain right hip recommend right total hip arthroplasty.  Risk benefits surgery discussed with patient at length.  Risk include but are not limited to blood loss, infection, wound healing problems, nerve vessel injury, and DVT PE.  She will need anticoagulation above aspirin due to the fact that she has a history of DVT.  Follow-Up Instructions: Return for post op.   Orders:  Orders Placed This Encounter  Procedures   XR HIP UNILAT W OR W/O PELVIS 2-3 VIEWS RIGHT   No orders of the defined types were placed in this encounter.     Procedures: No procedures performed   Clinical Data: No additional findings.   Subjective: No chief complaint on file.   HPI [name] returns today due to bilateral hip pain.  We last saw her in 2021 and at that time she had significant AVN of the left hip.  She comes in today with right hip pain greater than left.  States pains been present in the right hip for the past 6 months.  Ranks her right hip pain to be 10 out of 10 pain lower left hip pain is 8-9 out of 10 pain.  She has had no new injuries.  She does walk with a rollator.  She also will use a cane at times.  She notes that she has a history of a DVT which was related to her birth control use.  Review of Systems  Constitutional:  Negative for chills, fever and unexpected weight change.  Respiratory:  Negative for shortness of breath.   Cardiovascular:  Negative for chest pain.     Objective: Vital Signs: Ht 5\' 4"  (1.626 m)   Wt 141 lb 9.6 oz  (64.2 kg)   BMI 24.31 kg/m   Physical Exam Constitutional:      Appearance: She is normal weight. She is not ill-appearing or diaphoretic.  Pulmonary:     Effort: Pulmonary effort is normal.  Neurological:     Mental Status: She is alert and oriented to person, place, and time.  Psychiatric:        Mood and Affect: Mood normal.     Ortho Exam Bilateral hips limited external rotation and virtually no internal rotation of either hip range of motion either hip causes significant pain.  Calf supple nontender bilaterally.  Dorsiflexion plantarflexion bilateral ankles intact. Specialty Comments:  No specialty comments available.  Imaging: XR HIP UNILAT W OR W/O PELVIS 2-3 VIEWS RIGHT  Result Date: 12/15/2021 A P pelvis lateral view of the right hip: No acute fracture.  Avascular necrosis bilateral hips.  Both hips are well located.  Flattening and significant irregularity of the left hip.  No other acute findings.     PMFS History: Patient Active Problem List   Diagnosis Date Noted   Avascular necrosis of bone of left hip ([place]) 02/04/2020   No past medical history on  file.  No family history on file.  Past Surgical History:  Procedure Laterality Date   APPENDECTOMY     BILATERAL TEMPOROMANDIBULAR JOINT ARTHROPLASTY     Fluid removed from lung     Social History   Occupational History   Not on file  Tobacco Use   Smoking status: Never   Smokeless tobacco: Not on file  Substance and Sexual Activity   Alcohol use: Yes   Drug use: No   Sexual activity: Not on file

## 2022-01-24 ENCOUNTER — Telehealth: Payer: Self-pay

## 2022-01-24 NOTE — Telephone Encounter (Signed)
Patient is requesting a handicapped placard.  Please call.  [phone]

## 2022-01-25 ENCOUNTER — Other Ambulatory Visit: Payer: Self-pay

## 2022-01-25 NOTE — Telephone Encounter (Signed)
Is this ok? 6 months?

## 2022-01-25 NOTE — Progress Notes (Signed)
Pt. Needs orders for surgery. 

## 2022-01-25 NOTE — Telephone Encounter (Signed)
Lvm informing it is ready for pick up

## 2022-01-25 NOTE — Telephone Encounter (Signed)
Completed. Will placed @ front desk soon and notify pt.

## 2022-01-25 NOTE — Patient Instructions (Addendum)
DUE TO COVID-19 ONLY TWO VISITORS  (aged 65 and older)  ARE ALLOWED TO COME WITH YOU AND STAY IN THE WAITING ROOM ONLY DURING PRE OP AND PROCEDURE.   **NO VISITORS ARE ALLOWED IN THE SHORT STAY AREA OR RECOVERY ROOM!!**  IF YOU WILL BE ADMITTED INTO THE HOSPITAL YOU ARE ALLOWED ONLY FOUR SUPPORT PEOPLE DURING VISITATION HOURS ONLY (7 AM -8PM)   The support person(s) must pass our screening, gel in and out, and wear a mask at all times, including in the patient's room. Patients must also wear a mask when staff or their support person are in the room. Visitors GUEST BADGE MUST BE WORN VISIBLY  One adult visitor may remain with you overnight and MUST be in the room by 8 P.M.     Your procedure is scheduled on: 02/03/22   Report to [facility] Main Entrance    Report to admitting at : 10:30 AM   Call this number if you have problems the morning of surgery [phone]   Do not eat food :After Midnight.   After Midnight you may have the following liquids until : 10:00 AM DAY OF SURGERY  Water Black Coffee (sugar ok, NO MILK/CREAM OR CREAMERS)  Tea (sugar ok, NO MILK/CREAM OR CREAMERS) regular and decaf                             Plain Jell-O (NO RED)                                           Fruit ices (not with fruit pulp, NO RED)                                     Popsicles (NO RED)                                                                  Juice: apple, WHITE grape, WHITE cranberry Sports drinks like Gatorade (NO RED)     Oral Hygiene is also important to reduce your risk of infection.                                    Remember - BRUSH YOUR TEETH THE MORNING OF SURGERY WITH YOUR REGULAR TOOTHPASTE   Do NOT smoke after Midnight   Take these medicines the morning of surgery with A SIP OF WATER:N/A   DO NOT TAKE ANY ORAL DIABETIC MEDICATIONS DAY OF YOUR SURGERY  Bring CPAP mask and tubing day of surgery.                              You may not have any metal on  your body including hair pins, jewelry, and body piercing             Do not wear make-up, lotions, powders, perfumes/cologne, or deodorant  Do not wear nail polish including  gel and S&S, artificial/acrylic nails, or any other type of covering on natural nails including finger and toenails. If you have artificial nails, gel coating, etc. that needs to be removed by a nail salon please have this removed prior to surgery or surgery may need to be canceled/ delayed if the surgeon/ anesthesia feels like they are unable to be safely monitored.   Do not shave  48 hours prior to surgery.   Do not bring valuables to the hospital. [facility] IS NOT             RESPONSIBLE   FOR VALUABLES.   Contacts, dentures or bridgework may not be worn into surgery.   Bring small overnight bag day of surgery.   DO NOT BRING YOUR HOME MEDICATIONS TO THE HOSPITAL. PHARMACY WILL DISPENSE MEDICATIONS LISTED ON YOUR MEDICATION LIST TO YOU DURING YOUR ADMISSION IN THE HOSPITAL!    Patients discharged on the day of surgery will not be allowed to drive home.  Someone NEEDS to stay with you for the first 24 hours after anesthesia.   Special Instructions: Bring a copy of your healthcare power of attorney and living will documents         the day of surgery if you haven't scanned them before.              Please read over the following fact sheets you were given: IF YOU HAVE QUESTIONS ABOUT YOUR PRE-OP INSTRUCTIONS PLEASE CALL [phone]     [facility] - Preparing for Surgery Before surgery, you can play an important role.  Because skin is not sterile, your skin needs to be as free of germs as possible.  You can reduce the number of germs on your skin by washing with CHG (chlorahexidine gluconate) soap before surgery.  CHG is an antiseptic cleaner which kills germs and bonds with the skin to continue killing germs even after washing. Please DO NOT use if you have an allergy to CHG or antibacterial soaps.  If your skin  becomes reddened/irritated stop using the CHG and inform your nurse when you arrive at Short Stay. Do not shave (including legs and underarms) for at least 48 hours prior to the first CHG shower.  You may shave your face/neck. Please follow these instructions carefully:  1.  Shower with CHG Soap the night before surgery and the  morning of Surgery.  2.  If you choose to wash your hair, wash your hair first as usual with your  normal  shampoo.  3.  After you shampoo, rinse your hair and body thoroughly to remove the  shampoo.                           4.  Use CHG as you would any other liquid soap.  You can apply chg directly  to the skin and wash                       Gently with a scrungie or clean washcloth.  5.  Apply the CHG Soap to your body ONLY FROM THE NECK DOWN.   Do not use on face/ open                           Wound or open sores. Avoid contact with eyes, ears mouth and genitals (private parts).  Wash face,  Genitals (private parts) with your normal soap.             6.  Wash thoroughly, paying special attention to the area where your surgery  will be performed.  7.  Thoroughly rinse your body with warm water from the neck down.  8.  DO NOT shower/wash with your normal soap after using and rinsing off  the CHG Soap.                9.  Pat yourself dry with a clean towel.            10.  Wear clean pajamas.            11.  Place clean sheets on your bed the night of your first shower and do not  sleep with pets. Day of Surgery : Do not apply any lotions/deodorants the morning of surgery.  Please wear clean clothes to the hospital/surgery center.  FAILURE TO FOLLOW THESE INSTRUCTIONS MAY RESULT IN THE CANCELLATION OF YOUR SURGERY PATIENT SIGNATURE_________________________________  NURSE SIGNATURE__________________________________  ________________________________________________________________________

## 2022-01-26 ENCOUNTER — Other Ambulatory Visit: Payer: Self-pay

## 2022-01-26 ENCOUNTER — Encounter (HOSPITAL_COMMUNITY)
Admission: RE | Admit: 2022-01-26 | Discharge: 2022-01-26 | Disposition: A | Discharge status: Home / Self Care | Payer: PPO | Source: Ambulatory Visit | Attending: Orthopaedic Surgery | Admitting: Orthopaedic Surgery

## 2022-01-26 ENCOUNTER — Encounter (HOSPITAL_COMMUNITY): Payer: Self-pay

## 2022-01-26 VITALS — BP 153/98 | HR 106 | Temp 97.8°F | Ht 64.0 in | Wt 139.0 lb

## 2022-01-26 DIAGNOSIS — Z01812 Encounter for preprocedural laboratory examination: Secondary | ICD-10-CM | POA: Diagnosis not present

## 2022-01-26 DIAGNOSIS — Z01818 Encounter for other preprocedural examination: Secondary | ICD-10-CM

## 2022-01-26 HISTORY — DX: Unspecified osteoarthritis, unspecified site: M19.90

## 2022-01-26 HISTORY — DX: Unspecified asthma, uncomplicated: J45.909

## 2022-01-26 LAB — CBC
HCT: 46.7 % — ABNORMAL HIGH (ref 36.0–46.0)
Hemoglobin: 15.7 g/dL — ABNORMAL HIGH (ref 12.0–15.0)
MCH: 36.6 pg — ABNORMAL HIGH (ref 26.0–34.0)
MCHC: 33.6 g/dL (ref 30.0–36.0)
MCV: 108.9 fL — ABNORMAL HIGH (ref 80.0–100.0)
Platelets: 176 10*3/uL (ref 150–400)
RBC: 4.29 MIL/uL (ref 3.87–5.11)
RDW: 12.8 % (ref 11.5–15.5)
WBC: 5 10*3/uL (ref 4.0–10.5)
nRBC: 0 % (ref 0.0–0.2)

## 2022-01-26 LAB — SURGICAL PCR SCREEN
MRSA, PCR: NEGATIVE
Staphylococcus aureus: POSITIVE — AB

## 2022-01-26 NOTE — Progress Notes (Addendum)
For Short Stay: COVID SWAB appointment date: Date of COVID positive in last 90 days:  Bowel Prep reminder:   For Anesthesia: PCP - NO PCP Cardiologist -   Chest x-ray -  EKG -  Stress Test -  ECHO -  Cardiac Cath -  Pacemaker/ICD device last checked: Pacemaker orders received: Device Rep notified:  Spinal Cord Stimulator:  Sleep Study -  CPAP -   Fasting Blood Sugar -  Checks Blood Sugar _____ times a day Date and result of last Hgb A1c-  Blood Thinner Instructions: Aspirin Instructions: Last Dose:  Activity level: Can go up a flight of stairs and activities of daily living without stopping and without chest pain and/or shortness of breath   Able to exercise without chest pain and/or shortness of breath   Unable to go up a flight of stairs without chest pain and/or shortness of breath     Anesthesia review:   Patient denies shortness of breath, fever, cough and chest pain at PAT appointment   Patient verbalized understanding of instructions that were given to them at the PAT appointment. Patient was also instructed that they will need to review over the PAT instructions again at home before surgery.  

## 2022-01-27 ENCOUNTER — Other Ambulatory Visit: Payer: Self-pay | Admitting: Physician Assistant

## 2022-01-27 DIAGNOSIS — M87051 Idiopathic aseptic necrosis of right femur: Secondary | ICD-10-CM

## 2022-01-27 NOTE — Progress Notes (Signed)
Patient's PCR screen is positive for Staph. This note was routed to Dr. Magnus Ivan for review. Appropriate notations were made on the patient's chart for THA surgery on 02/03/22.

## 2022-01-30 NOTE — Progress Notes (Signed)
Preop testing, need hb and plt prior to hip surgery

## 2022-02-02 DIAGNOSIS — M87051 Idiopathic aseptic necrosis of right femur: Secondary | ICD-10-CM

## 2022-02-02 NOTE — H&P (Signed)
TOTAL HIP ADMISSION H&P  Patient is admitted for right total hip arthroplasty.  Subjective:  Chief Complaint: right hip pain  HPI: Heidi [name], 66 y.o. female, has a history of pain and functional disability in the right hip(s) due to  avascular necrosis  and patient has failed non-surgical conservative treatments for greater than 12 weeks to include NSAID's and/or analgesics, use of assistive devices, and activity modification.  Onset of symptoms was gradual starting 4 years ago with rapidlly worsening course since that time.The patient noted no past surgery on the right hip(s).  Patient currently rates pain in the right hip at 10 out of 10 with activity. Patient has night pain, worsening of pain with activity and weight bearing, trendelenberg gait, pain that interfers with activities of daily living, and pain with passive range of motion. Patient has evidence of subchondral cysts and femoral head collapse  by imaging studies. This condition presents safety issues increasing the risk of falls. This patient has had avascular necrosis of the hip, acetabular fracture, hip dysplasia.  There is no current active infection.  Patient Active Problem List   Diagnosis Date Noted   Avascular necrosis of hip, right (HCC) 02/02/2022   Avascular necrosis of bone of left hip (HCC) 02/04/2020   Past Medical History:  Diagnosis Date   Arthritis    Asthma     Past Surgical History:  Procedure Laterality Date   ANKLE FRACTURE SURGERY Left    APPENDECTOMY     BILATERAL TEMPOROMANDIBULAR JOINT ARTHROPLASTY     Fluid removed from lung     TONSILLECTOMY      No current facility-administered medications for this encounter.   Current Outpatient Medications  Medication Sig Dispense Refill Last Dose   loperamide (IMODIUM A-D) 2 MG tablet Take 2 mg by mouth 4 (four) times daily as needed for diarrhea or loose stools.      loratadine (CLARITIN) 10 MG tablet Take 10 mg by mouth daily.      naproxen sodium  (ALEVE) 220 MG tablet Take 220 mg by mouth daily as needed (pain).      OVER THE COUNTER MEDICATION Take 3-4 capsules by mouth at bedtime. CBD 100 mg      omeprazole (PRILOSEC) 20 MG capsule Take 20 mg by mouth as needed.      Allergies  Allergen Reactions   Codeine     "Makes me really sick."    Erythromycin     "Makes me really sick."     Social History   Tobacco Use   Smoking status: Never   Smokeless tobacco: Not on file  Substance Use Topics   Alcohol use: Yes    Comment: ocass.    No family history on file.   Review of Systems  All other systems reviewed and are negative.   Objective:  Physical Exam Vitals reviewed.  Constitutional:      Appearance: Normal appearance.  HENT:     Head: Normocephalic and atraumatic.  Eyes:     Extraocular Movements: Extraocular movements intact.     Pupils: Pupils are equal, round, and reactive to light.  Cardiovascular:     Rate and Rhythm: Normal rate and regular rhythm.  Pulmonary:     Effort: Pulmonary effort is normal.     Breath sounds: Normal breath sounds.  Abdominal:     Palpations: Abdomen is soft.  Musculoskeletal:     Cervical back: Normal range of motion and neck supple.     Right hip: Tenderness  and bony tenderness present. Decreased range of motion. Decreased strength.  Neurological:     Mental Status: She is alert and oriented to person, place, and time.  Psychiatric:        Behavior: Behavior normal.     Vital signs in last 24 hours:    Labs:   Estimated body mass index is 23.86 kg/m as calculated from the following:   Height as of 01/26/22: 5\' 4"  (1.626 m).   Weight as of 01/26/22: 63 kg.   Imaging Review Plain radiographs demonstrate severe avascular necrosis of both hip(s). The bone quality appears to be good for age and reported activity level.      Assessment/Plan:  End AVN right hip(s)  The patient history, physical examination, clinical judgement of the provider and imaging studies  are consistent with avascular necrosis of the right hip(s) and total hip arthroplasty is deemed medically necessary. The treatment options including medical management, injection therapy, arthroscopy and arthroplasty were discussed at length. The risks and benefits of total hip arthroplasty were presented and reviewed. The risks due to aseptic loosening, infection, stiffness, dislocation/subluxation,  thromboembolic complications and other imponderables were discussed.  The patient acknowledged the explanation, agreed to proceed with the plan and consent was signed. Patient is being admitted for inpatient treatment for surgery, pain control, PT, OT, prophylactic antibiotics, VTE prophylaxis, progressive ambulation and ADL's and discharge planning.The patient is planning to be discharged home with home health services

## 2022-02-03 ENCOUNTER — Inpatient Hospital Stay (HOSPITAL_COMMUNITY)
Admission: RE | Admit: 2022-02-03 | Discharge: 2022-02-07 | DRG: 470 | Disposition: A | Discharge status: Home Health | Payer: PPO | Attending: Orthopaedic Surgery | Admitting: Orthopaedic Surgery

## 2022-02-03 ENCOUNTER — Encounter (HOSPITAL_COMMUNITY): Payer: Self-pay | Admitting: Orthopaedic Surgery

## 2022-02-03 ENCOUNTER — Ambulatory Visit (HOSPITAL_COMMUNITY): Payer: PPO | Admitting: Anesthesiology

## 2022-02-03 ENCOUNTER — Observation Stay (HOSPITAL_COMMUNITY): Payer: PPO

## 2022-02-03 ENCOUNTER — Other Ambulatory Visit: Payer: Self-pay

## 2022-02-03 ENCOUNTER — Ambulatory Visit (HOSPITAL_COMMUNITY): Payer: PPO

## 2022-02-03 ENCOUNTER — Encounter (HOSPITAL_COMMUNITY)
Admission: RE | Disposition: A | Discharge status: Home Health | Payer: Self-pay | Source: Home / Self Care | Attending: Orthopaedic Surgery

## 2022-02-03 DIAGNOSIS — Z96641 Presence of right artificial hip joint: Secondary | ICD-10-CM

## 2022-02-03 DIAGNOSIS — M879 Osteonecrosis, unspecified: Principal | ICD-10-CM | POA: Diagnosis present

## 2022-02-03 DIAGNOSIS — Z79899 Other long term (current) drug therapy: Secondary | ICD-10-CM

## 2022-02-03 DIAGNOSIS — M87051 Idiopathic aseptic necrosis of right femur: Secondary | ICD-10-CM

## 2022-02-03 DIAGNOSIS — Z881 Allergy status to other antibiotic agents status: Secondary | ICD-10-CM

## 2022-02-03 DIAGNOSIS — M1990 Unspecified osteoarthritis, unspecified site: Secondary | ICD-10-CM | POA: Diagnosis present

## 2022-02-03 DIAGNOSIS — Z885 Allergy status to narcotic agent status: Secondary | ICD-10-CM

## 2022-02-03 DIAGNOSIS — J45909 Unspecified asthma, uncomplicated: Secondary | ICD-10-CM | POA: Diagnosis present

## 2022-02-03 DIAGNOSIS — Q6589 Other specified congenital deformities of hip: Secondary | ICD-10-CM

## 2022-02-03 HISTORY — PX: TOTAL HIP ARTHROPLASTY: SHX124

## 2022-02-03 LAB — TYPE AND SCREEN
ABO/RH(D): O POS
Antibody Screen: NEGATIVE

## 2022-02-03 LAB — ABO/RH: ABO/RH(D): O POS

## 2022-02-03 SURGERY — ARTHROPLASTY, HIP, TOTAL, ANTERIOR APPROACH
Anesthesia: Monitor Anesthesia Care | Site: Hip | Laterality: Right

## 2022-02-03 MED ORDER — DEXMEDETOMIDINE (PRECEDEX) IN NS 20 MCG/5ML (4 MCG/ML) IV SYRINGE
PREFILLED_SYRINGE | INTRAVENOUS | Status: DC | PRN
  Administered 2022-02-03 (×2): 12 ug via INTRAVENOUS

## 2022-02-03 MED ORDER — ALUM & MAG HYDROXIDE-SIMETH 200-200-20 MG/5ML PO SUSP
30.0000 mL | ORAL | Status: DC | PRN
Start: 2022-02-03 — End: 2022-02-08
  Administered 2022-02-04: 30 mL via ORAL
  Filled 2022-02-03: qty 30

## 2022-02-03 MED ORDER — SODIUM CHLORIDE 0.9 % IR SOLN
Status: DC | PRN
  Administered 2022-02-03: 1000 mL

## 2022-02-03 MED ORDER — ONDANSETRON HCL 4 MG/2ML IJ SOLN: 4.0000 mg | Freq: Once | INTRAMUSCULAR | Status: DC | PRN

## 2022-02-03 MED ORDER — CEFAZOLIN SODIUM-DEXTROSE 1-4 GM/50ML-% IV SOLN
1.0000 g | Freq: Four times a day (QID) | INTRAVENOUS | Status: AC
  Administered 2022-02-03 (×2): 1 g via INTRAVENOUS
  Filled 2022-02-03 (×2): qty 50

## 2022-02-03 MED ORDER — OXYCODONE HCL 5 MG/5ML PO SOLN: 5.0000 mg | Freq: Once | ORAL | Status: DC | PRN

## 2022-02-03 MED ORDER — PANTOPRAZOLE SODIUM 40 MG PO TBEC
40.0000 mg | DELAYED_RELEASE_TABLET | Freq: Every day | ORAL | Status: DC
  Administered 2022-02-03 – 2022-02-07 (×5): 40 mg via ORAL
  Filled 2022-02-03 (×5): qty 1

## 2022-02-03 MED ORDER — ONDANSETRON HCL 4 MG PO TABS
4.0000 mg | ORAL_TABLET | Freq: Four times a day (QID) | ORAL | Status: DC | PRN
  Administered 2022-02-05 – 2022-02-06 (×5): 4 mg via ORAL
  Filled 2022-02-03 (×5): qty 1

## 2022-02-03 MED ORDER — FENTANYL CITRATE (PF) 100 MCG/2ML IJ SOLN
INTRAMUSCULAR | Status: AC
  Filled 2022-02-03: qty 2

## 2022-02-03 MED ORDER — HYDROMORPHONE HCL 1 MG/ML IJ SOLN: 0.2500 mg | INTRAMUSCULAR | Status: DC | PRN

## 2022-02-03 MED ORDER — OXYCODONE HCL 5 MG PO TABS: 5.0000 mg | ORAL_TABLET | Freq: Once | ORAL | Status: DC | PRN

## 2022-02-03 MED ORDER — METHOCARBAMOL 500 MG PO TABS
500.0000 mg | ORAL_TABLET | Freq: Four times a day (QID) | ORAL | Status: DC | PRN
  Administered 2022-02-04 – 2022-02-07 (×12): 500 mg via ORAL
  Filled 2022-02-03 (×14): qty 1

## 2022-02-03 MED ORDER — MIDAZOLAM HCL 5 MG/5ML IJ SOLN
INTRAMUSCULAR | Status: DC | PRN
  Administered 2022-02-03: 2 mg via INTRAVENOUS

## 2022-02-03 MED ORDER — POLYETHYLENE GLYCOL 3350 17 G PO PACK: 17.0000 g | PACK | Freq: Every day | ORAL | Status: DC | PRN

## 2022-02-03 MED ORDER — HYDROMORPHONE HCL 1 MG/ML IJ SOLN
0.5000 mg | INTRAMUSCULAR | Status: DC | PRN
  Administered 2022-02-03 – 2022-02-07 (×19): 1 mg via INTRAVENOUS
  Filled 2022-02-03 (×19): qty 1

## 2022-02-03 MED ORDER — BUPIVACAINE IN DEXTROSE 0.75-8.25 % IT SOLN
INTRATHECAL | Status: DC | PRN
  Administered 2022-02-03: 1.6 mL via INTRATHECAL

## 2022-02-03 MED ORDER — KETOROLAC TROMETHAMINE 30 MG/ML IJ SOLN: 15.0000 mg | Freq: Once | INTRAMUSCULAR | Status: DC | PRN

## 2022-02-03 MED ORDER — SODIUM CHLORIDE 0.9 % IV SOLN
INTRAVENOUS | Status: DC
Start: 2022-02-03 — End: 2022-02-08

## 2022-02-03 MED ORDER — POVIDONE-IODINE 10 % EX SWAB
2.0000 | Freq: Once | CUTANEOUS | Status: AC
  Administered 2022-02-03: 2 via TOPICAL

## 2022-02-03 MED ORDER — METOCLOPRAMIDE HCL 5 MG PO TABS
5.0000 mg | ORAL_TABLET | Freq: Three times a day (TID) | ORAL | Status: DC | PRN
  Administered 2022-02-05: 10 mg via ORAL
  Filled 2022-02-03: qty 2

## 2022-02-03 MED ORDER — PROPOFOL 500 MG/50ML IV EMUL
INTRAVENOUS | Status: DC | PRN
  Administered 2022-02-03 (×2): 40 mg via INTRAVENOUS

## 2022-02-03 MED ORDER — OXYCODONE HCL 5 MG PO TABS: 5.0000 mg | ORAL_TABLET | ORAL | Status: DC | PRN

## 2022-02-03 MED ORDER — ONDANSETRON HCL 4 MG/2ML IJ SOLN
4.0000 mg | Freq: Four times a day (QID) | INTRAMUSCULAR | Status: DC | PRN
  Administered 2022-02-03 – 2022-02-07 (×7): 4 mg via INTRAVENOUS
  Filled 2022-02-03 (×7): qty 2

## 2022-02-03 MED ORDER — ACETAMINOPHEN 325 MG PO TABS
325.0000 mg | ORAL_TABLET | Freq: Four times a day (QID) | ORAL | Status: DC | PRN
  Administered 2022-02-03 – 2022-02-07 (×7): 650 mg via ORAL
  Filled 2022-02-03 (×7): qty 2

## 2022-02-03 MED ORDER — ORAL CARE MOUTH RINSE: 15.0000 mL | Freq: Once | OROMUCOSAL | Status: AC

## 2022-02-03 MED ORDER — ASPIRIN 81 MG PO CHEW
81.0000 mg | CHEWABLE_TABLET | Freq: Two times a day (BID) | ORAL | Status: DC
Start: 2022-02-03 — End: 2022-02-08
  Administered 2022-02-03 – 2022-02-07 (×8): 81 mg via ORAL
  Filled 2022-02-03 (×8): qty 1

## 2022-02-03 MED ORDER — TRANEXAMIC ACID-NACL 1000-0.7 MG/100ML-% IV SOLN
1000.0000 mg | INTRAVENOUS | Status: AC
  Administered 2022-02-03: 1000 mg via INTRAVENOUS
  Filled 2022-02-03: qty 100

## 2022-02-03 MED ORDER — DOCUSATE SODIUM 100 MG PO CAPS
100.0000 mg | ORAL_CAPSULE | Freq: Two times a day (BID) | ORAL | Status: DC
  Administered 2022-02-03 – 2022-02-07 (×8): 100 mg via ORAL
  Filled 2022-02-03 (×8): qty 1

## 2022-02-03 MED ORDER — METHOCARBAMOL 500 MG IVPB - SIMPLE MED: 500.0000 mg | Freq: Four times a day (QID) | INTRAVENOUS | Status: DC | PRN

## 2022-02-03 MED ORDER — METOCLOPRAMIDE HCL 5 MG/ML IJ SOLN
5.0000 mg | Freq: Three times a day (TID) | INTRAMUSCULAR | Status: DC | PRN
  Administered 2022-02-03 – 2022-02-06 (×6): 10 mg via INTRAVENOUS
  Filled 2022-02-03 (×6): qty 2

## 2022-02-03 MED ORDER — LACTATED RINGERS IV SOLN: INTRAVENOUS | Status: DC

## 2022-02-03 MED ORDER — HYDROMORPHONE HCL 2 MG PO TABS
2.0000 mg | ORAL_TABLET | ORAL | Status: DC | PRN
  Administered 2022-02-03 – 2022-02-04 (×4): 2 mg via ORAL
  Administered 2022-02-04 (×2): 3 mg via ORAL
  Administered 2022-02-04 – 2022-02-07 (×13): 2 mg via ORAL
  Filled 2022-02-03 (×16): qty 1
  Filled 2022-02-03: qty 2
  Filled 2022-02-03: qty 1
  Filled 2022-02-03: qty 2

## 2022-02-03 MED ORDER — ACETAMINOPHEN 500 MG PO TABS
1000.0000 mg | ORAL_TABLET | Freq: Once | ORAL | Status: AC
Start: 2022-02-03 — End: 2022-02-03
  Administered 2022-02-03: 1000 mg via ORAL
  Filled 2022-02-03: qty 2

## 2022-02-03 MED ORDER — CHLORHEXIDINE GLUCONATE 0.12 % MT SOLN
15.0000 mL | Freq: Once | OROMUCOSAL | Status: AC
  Administered 2022-02-03: 15 mL via OROMUCOSAL

## 2022-02-03 MED ORDER — AMISULPRIDE (ANTIEMETIC) 5 MG/2ML IV SOLN
10.0000 mg | Freq: Once | INTRAVENOUS | Status: DC | PRN
Start: 2022-02-03 — End: 2022-02-03

## 2022-02-03 MED ORDER — MENTHOL 3 MG MT LOZG: 1.0000 | LOZENGE | OROMUCOSAL | Status: DC | PRN

## 2022-02-03 MED ORDER — 0.9 % SODIUM CHLORIDE (POUR BTL) OPTIME
TOPICAL | Status: DC | PRN
  Administered 2022-02-03: 1000 mL

## 2022-02-03 MED ORDER — CEFAZOLIN SODIUM-DEXTROSE 2-4 GM/100ML-% IV SOLN
2.0000 g | INTRAVENOUS | Status: AC
  Administered 2022-02-03: 2 g via INTRAVENOUS
  Filled 2022-02-03: qty 100

## 2022-02-03 MED ORDER — PHENOL 1.4 % MT LIQD: 1.0000 | OROMUCOSAL | Status: DC | PRN

## 2022-02-03 MED ORDER — MIDAZOLAM HCL 2 MG/2ML IJ SOLN
INTRAMUSCULAR | Status: AC
  Filled 2022-02-03: qty 2

## 2022-02-03 MED ORDER — DIPHENHYDRAMINE HCL 12.5 MG/5ML PO ELIX: 12.5000 mg | ORAL_SOLUTION | ORAL | Status: DC | PRN

## 2022-02-03 SURGICAL SUPPLY — 46 items
APL SKNCLS STERI-STRIP NONHPOA (GAUZE/BANDAGES/DRESSINGS)
BAG COUNTER SPONGE SURGICOUNT (BAG) ×2 IMPLANT
BAG SPEC THK2 15X12 ZIP CLS (MISCELLANEOUS)
BAG ZIPLOCK 12X15 (MISCELLANEOUS) IMPLANT
BALL HIP CERAMIC (Hips) IMPLANT
BENZOIN TINCTURE PRP APPL 2/3 (GAUZE/BANDAGES/DRESSINGS) IMPLANT
BLADE SAW SGTL 18X1.27X75 (BLADE) ×2 IMPLANT
COVER PERINEAL POST (MISCELLANEOUS) ×2 IMPLANT
COVER SURGICAL LIGHT HANDLE (MISCELLANEOUS) ×2 IMPLANT
CUP SECTOR GRIPTON 50MM (Cup) ×1 IMPLANT
DRAPE FOOT SWITCH (DRAPES) ×2 IMPLANT
DRAPE STERI IOBAN 125X83 (DRAPES) ×2 IMPLANT
DRAPE U-SHAPE 47X51 STRL (DRAPES) ×4 IMPLANT
DRESSING AQUACEL AG SP 3.5X10 (GAUZE/BANDAGES/DRESSINGS) IMPLANT
DRSG AQUACEL AG ADV 3.5X10 (GAUZE/BANDAGES/DRESSINGS) ×2 IMPLANT
DRSG AQUACEL AG SP 3.5X10 (GAUZE/BANDAGES/DRESSINGS) ×2
DURAPREP 26ML APPLICATOR (WOUND CARE) ×2 IMPLANT
ELECT REM PT RETURN 15FT ADLT (MISCELLANEOUS) ×2 IMPLANT
FEM STEM 12/14 TAPER SZ 4 HIP (Orthopedic Implant) ×2 IMPLANT
FEMORAL STEM 12/14 TPR SZ4 HIP (Orthopedic Implant) IMPLANT
GAUZE XEROFORM 1X8 LF (GAUZE/BANDAGES/DRESSINGS) ×2 IMPLANT
GLOVE BIO SURGEON STRL SZ7.5 (GLOVE) ×2 IMPLANT
GLOVE BIOGEL PI IND STRL 8 (GLOVE) ×2 IMPLANT
GLOVE BIOGEL PI INDICATOR 8 (GLOVE) ×2
GLOVE ECLIPSE 8.0 STRL XLNG CF (GLOVE) ×2 IMPLANT
GOWN STRL REUS W/ TWL XL LVL3 (GOWN DISPOSABLE) ×2 IMPLANT
GOWN STRL REUS W/TWL XL LVL3 (GOWN DISPOSABLE) ×4
HANDPIECE INTERPULSE COAX TIP (DISPOSABLE) ×2
HIP BALL CERAMIC (Hips) ×2 IMPLANT
HOLDER FOLEY CATH W/STRAP (MISCELLANEOUS) ×2 IMPLANT
KIT TURNOVER KIT A (KITS) IMPLANT
LINER ACET PNNCL PLUS4 NEUTRAL (Hips) IMPLANT
PACK ANTERIOR HIP CUSTOM (KITS) ×2 IMPLANT
PINNACLE PLUS 4 NEUTRAL (Hips) ×2 IMPLANT
SET HNDPC FAN SPRY TIP SCT (DISPOSABLE) ×1 IMPLANT
STAPLER VISISTAT 35W (STAPLE) IMPLANT
STRIP CLOSURE SKIN 1/2X4 (GAUZE/BANDAGES/DRESSINGS) IMPLANT
SUT ETHIBOND NAB CT1 #1 30IN (SUTURE) ×2 IMPLANT
SUT ETHILON 2 0 PS N (SUTURE) IMPLANT
SUT MNCRL AB 4-0 PS2 18 (SUTURE) IMPLANT
SUT VIC AB 0 CT1 36 (SUTURE) ×2 IMPLANT
SUT VIC AB 1 CT1 36 (SUTURE) ×2 IMPLANT
SUT VIC AB 2-0 CT1 27 (SUTURE) ×4
SUT VIC AB 2-0 CT1 TAPERPNT 27 (SUTURE) ×2 IMPLANT
TRAY FOLEY MTR SLVR 16FR STAT (SET/KITS/TRAYS/PACK) IMPLANT
YANKAUER SUCT BULB TIP NO VENT (SUCTIONS) ×2 IMPLANT

## 2022-02-03 NOTE — Anesthesia Preprocedure Evaluation (Addendum)
Anesthesia Evaluation  Patient identified by MRN, date of birth, ID band Patient awake    Reviewed: Allergy & Precautions, NPO status , Patient's Chart, lab work & pertinent test results  Airway Mallampati: III  TM Distance: >3 FB Neck ROM: Full    Dental  (+) Teeth Intact, Dental Advisory Given   Pulmonary asthma (well controlled ) ,    Pulmonary exam normal breath sounds clear to auscultation       Cardiovascular negative cardio ROS Normal cardiovascular exam Rhythm:Regular Rate:Normal     Neuro/Psych negative neurological ROS  negative psych ROS   GI/Hepatic Neg liver ROS, GERD  Medicated and Controlled,  Endo/Other  negative endocrine ROS  Renal/GU negative Renal ROS  negative genitourinary   Musculoskeletal  (+) Arthritis , Osteoarthritis,    Abdominal   Peds  Hematology negative hematology ROS (+) Hb 15.7, plt 176   Anesthesia Other Findings   Reproductive/Obstetrics negative OB ROS                            Anesthesia Physical Anesthesia Plan  ASA: 2  Anesthesia Plan: Spinal and MAC   Post-op Pain Management: Tylenol PO (pre-op)*   Induction:   PONV Risk Score and Plan: 2 and Propofol infusion and TIVA  Airway Management Planned: Natural Airway and Nasal Cannula  Additional Equipment: None  Intra-op Plan:   Post-operative Plan:   Informed Consent:   Plan Discussed with:   Anesthesia Plan Comments:         Anesthesia Quick Evaluation

## 2022-02-03 NOTE — Anesthesia Postprocedure Evaluation (Signed)
Anesthesia Post Note  Patient: Heidi [name]  Procedure(s) Performed: RIGHT TOTAL HIP ARTHROPLASTY ANTERIOR APPROACH (Right: Hip)     Patient location during evaluation: PACU Anesthesia Type: MAC and Spinal Level of consciousness: awake and alert and oriented Pain management: pain level controlled Vital Signs Assessment: post-procedure vital signs reviewed and stable Respiratory status: spontaneous breathing, nonlabored ventilation and respiratory function stable Cardiovascular status: blood pressure returned to baseline and stable Postop Assessment: no headache, no backache, spinal receding and patient able to bend at knees Anesthetic complications: no   No notable events documented.  Last Vitals:  Vitals:   02/03/22 1515 02/03/22 1530  BP: (!) 107/92 117/62  Pulse: 92 (!) 101  Resp: (!) 22 16  Temp:    SpO2: 100% 100%    Last Pain:  Vitals:   02/03/22 1530  TempSrc:   PainSc: 0-No pain    LLE Motor Response: Purposeful movement (02/03/22 1530) LLE Sensation: Numbness (02/03/22 1530) RLE Motor Response: Purposeful movement (02/03/22 1530) RLE Sensation: Numbness (02/03/22 1530) L Sensory Level: L4-Anterior knee, lower leg (02/03/22 1530) R Sensory Level: L2-Upper inner thigh, upper buttock (02/03/22 [name])  Pervis Hocking

## 2022-02-03 NOTE — Transfer of Care (Signed)
Immediate Anesthesia Transfer of Care Note  Patient: Heidi [name]  Procedure(s) Performed: RIGHT TOTAL HIP ARTHROPLASTY ANTERIOR APPROACH (Right: Hip)  Patient Location: PACU  Anesthesia Type:Spinal  Level of Consciousness: sedated, patient cooperative and responds to stimulation  Airway & Oxygen Therapy: Patient Spontanous Breathing and Patient connected to face mask oxygen  Post-op Assessment: Report given to RN and Post -op Vital signs reviewed and stable  Post vital signs: Reviewed and stable  Last Vitals:  Vitals Value Taken Time  BP 97/51 02/03/22 1401  Temp    Pulse 81 02/03/22 1403  Resp 18 02/03/22 1403  SpO2 100 % 02/03/22 1403  Vitals shown include unvalidated device data.  Last Pain:  Vitals:   02/03/22 1108  TempSrc:   PainSc: 10-Worst pain ever         Complications: No notable events documented.

## 2022-02-03 NOTE — Op Note (Signed)
Operative note  Date of operation: 02/03/2022 Preoperative diagnosis: Avascular necrosis right hip Postoperative diagnosis: Same as well  Procedure: Right total hip arthroplasty   Implants: DePuy sector GRIPTION acetabular head and size 50, size 32+4 neutral polythene liner, size 4 Actis femoral component with high offset, size 32+5 ceramic ball  Surgeon: Vanita Panda. Magnus Ivan, MD Assistant: Rexene Edison, PA-C  Anesthesia: Spinal EBL: 200 cc Antibiotics: 2 g IV Ancef Complications: None  Indications: The patient is a 66 year old female well-known to me.  She actually has significant AVN of both of her hips.  Both of them hurt equally but the right hip has been hurting her worse recently.  We have recommended both her hips being replaced acutely.  We need to address the right hip today and she agrees with this as well.  We talked in length in detail the risk of acute blood loss anemia, nerve or vessel injury, fracture, infection, DVT, implant failure, dislocation, and leg length differences.  We talked about her goals being decreased pain improve mobility and overall poor quality of life.  She knows we will dress the left hip as soon as we can.  Procedure description: After informed consent was obtained appropriate right hip was marked, the patient was brought to the operating room and set up on her stretcher.  Spinal anesthesia was obtained.  A Foley catheter was placed and the patient had traction boots placed on both her feet.  Next she was placed supine on the Hana fracture table with a panel placed in place in both legs and inline skeletal traction devices with no traction applied.  We assessed the hips radiographically and then the right operative hip was prepped and draped with DuraPrep and sterile drapes.  A timeout was called and she was identified as the correct patient and the correct right hip.  We then made incision just posterior and inferior to the ASIS.  This was taken down to  the tensor fascia lata muscle and the tensor fascia was then divided longitudinally to proceed with a direct anterior broach.  The circumflex vessels were identified and cauterized and then Cobra retractors were placed around the medial and lateral femoral neck.  A femoral neck cut was made with an oscillating saw just proximal to the lesser trochanter and completed with an osteotome.  A corkscrew guide was placed in the femoral head and the femoral head was removed in its entirety.  There was cartilage that was shortly flaking off from terrible AVN with femoral head collapse.  I then placed a bent Hohmann over the medial acetabular rim and remove remnants of the acetabular labrum and other debris.  We then began reaming under direct visualization from a size 43 reamer and stepwise increments, to a size 49 reamer again with all reamers placed under direct visualization of the last reamers placed under direct fluoroscopy to container depth of reaming, or inclination, and her anteversion.  I then placed the real DePuy sector GRIPTION acetabular opponent size 50 and we went with a 36+4 liner polyethylene for that size acetabular component.  Attention was then turned to the femur.  With the leg externally rotated, extended, and a deducted we were able to place a Mueller retractor medially and a Hohmann retractor behind the greater trochanter.  We then released the lateral joint capsule and used a box cutting osteotome to enter the femoral canal and lateralize as well.  We then began broaching using the Actis broaching system from a size 0 going  up to a size 4.  With a size 4 in place we trialed a standard offset femoral neck and a 32+1 hip ball.  We reduce this in the acetabulum and based on radiographic assessment I felt like we needed a little bit more offset and leg length.  We dislocated the hip and remove the trial components.  We then placed the real Actis femoral component size 4 but 1 with high offset.  We went  with the real 32+5 ceramic hip ball.  This was then reduced into the pelvis and we are pleased with range of motion, leg length, offset and stability.  This was assessed mechanically and radiographically.  The soft tissue was then irrigated with normal saline solution.  The joint capsule was closed with interrupted #1 Ethibond suture followed by #1 Vicryl to close the tensor fascia.  0 Vicryl using those of the tissue and 2-0 Vicryl was used to close the subcutaneous tissue.  The skin was closed with staples.  An Aquacel dressing was applied and she was taken off the Hana table and taken to the recovery room in stable condition with all final counts being correct and no complications noted.  Of note Rexene Edison, PA-C did assist during the entire case from beginning to end and his assistance was medically necessary and crucial for helping manage soft tissues, retract tissues, helping guide implant placement and a direct layered closure of the wound.

## 2022-02-03 NOTE — Anesthesia Procedure Notes (Signed)
Spinal  Patient location during procedure: OR Start time: 02/03/2022 12:42 PM End time: 02/03/2022 12:45 PM Reason for block: surgical anesthesia Staffing Performed: anesthesiologist  Anesthesiologist: Lannie Fields, DO Performed by: Lannie Fields, DO Authorized by: Lannie Fields, DO   Preanesthetic Checklist Completed: patient identified, IV checked, risks and benefits discussed, surgical consent, monitors and equipment checked, pre-op evaluation and timeout performed Spinal Block Patient position: sitting Prep: DuraPrep and site prepped and draped Patient monitoring: cardiac monitor, continuous pulse ox and blood pressure Approach: midline Location: L3-4 Injection technique: single-shot Needle Needle type: Pencan  Needle gauge: 24 G Needle length: 9 cm Assessment Sensory level: T6 Events: CSF return Additional Notes Functioning IV was confirmed and monitors were applied. Sterile prep and drape, including hand hygiene and sterile gloves were used. The patient was positioned and the spine was prepped. The skin was anesthetized with lidocaine.  Free flow of clear CSF was obtained prior to injecting local anesthetic into the CSF.  The spinal needle aspirated freely following injection.  The needle was carefully withdrawn.  The patient tolerated the procedure well.   Significant scoliosis

## 2022-02-03 NOTE — Interval H&P Note (Signed)
History and Physical Interval Note: The patient understands that she is here today for right hip replacement to treat her right hip avascular necrosis.  There has been no acute or interval change in her medical status.  See H&P.  The risks and benefits of surgery been explained in detail and informed consent is obtained.  The right operative hip has been marked.  02/03/2022 11:17 AM  Heidi [name]  has presented today for surgery, with the diagnosis of AVASCULAR NECROSIS RIGHT HIP.  The various methods of treatment have been discussed with the patient and family. After consideration of risks, benefits and other options for treatment, the patient has consented to  Procedure(s): RIGHT TOTAL HIP ARTHROPLASTY ANTERIOR APPROACH (Right) as a surgical intervention.  The patient's history has been reviewed, patient examined, no change in status, stable for surgery.  I have reviewed the patient's chart and labs.  Questions were answered to the patient's satisfaction.     Kathryne Hitch

## 2022-02-04 DIAGNOSIS — M25551 Pain in right hip: Secondary | ICD-10-CM | POA: Diagnosis present

## 2022-02-04 DIAGNOSIS — Z885 Allergy status to narcotic agent status: Secondary | ICD-10-CM | POA: Diagnosis not present

## 2022-02-04 DIAGNOSIS — Z881 Allergy status to other antibiotic agents status: Secondary | ICD-10-CM | POA: Diagnosis not present

## 2022-02-04 DIAGNOSIS — Z79899 Other long term (current) drug therapy: Secondary | ICD-10-CM | POA: Diagnosis not present

## 2022-02-04 DIAGNOSIS — J45909 Unspecified asthma, uncomplicated: Secondary | ICD-10-CM | POA: Diagnosis present

## 2022-02-04 DIAGNOSIS — M879 Osteonecrosis, unspecified: Secondary | ICD-10-CM | POA: Diagnosis present

## 2022-02-04 DIAGNOSIS — Q6589 Other specified congenital deformities of hip: Secondary | ICD-10-CM | POA: Diagnosis not present

## 2022-02-04 DIAGNOSIS — M1990 Unspecified osteoarthritis, unspecified site: Secondary | ICD-10-CM | POA: Diagnosis present

## 2022-02-04 LAB — CBC
HCT: 37.8 % (ref 36.0–46.0)
Hemoglobin: 12.5 g/dL (ref 12.0–15.0)
MCH: 36.4 pg — ABNORMAL HIGH (ref 26.0–34.0)
MCHC: 33.1 g/dL (ref 30.0–36.0)
MCV: 110.2 fL — ABNORMAL HIGH (ref 80.0–100.0)
Platelets: 166 10*3/uL (ref 150–400)
RBC: 3.43 MIL/uL — ABNORMAL LOW (ref 3.87–5.11)
RDW: 12.8 % (ref 11.5–15.5)
WBC: 6.9 10*3/uL (ref 4.0–10.5)
nRBC: 0 % (ref 0.0–0.2)

## 2022-02-04 MED ORDER — TIZANIDINE HCL 4 MG PO TABS: 4.0000 mg | ORAL_TABLET | Freq: Four times a day (QID) | ORAL | 0 refills | Status: DC | PRN

## 2022-02-04 MED ORDER — GABAPENTIN 100 MG PO CAPS
100.0000 mg | ORAL_CAPSULE | Freq: Three times a day (TID) | ORAL | Status: DC
  Administered 2022-02-04 – 2022-02-07 (×11): 100 mg via ORAL
  Filled 2022-02-04 (×11): qty 1

## 2022-02-04 MED ORDER — ASPIRIN 81 MG PO CHEW: 81.0000 mg | CHEWABLE_TABLET | Freq: Two times a day (BID) | ORAL | 1 refills | Status: DC

## 2022-02-04 MED ORDER — KETOROLAC TROMETHAMINE 15 MG/ML IJ SOLN
7.5000 mg | Freq: Four times a day (QID) | INTRAMUSCULAR | Status: AC
  Administered 2022-02-04 – 2022-02-05 (×4): 7.5 mg via INTRAVENOUS
  Filled 2022-02-04 (×4): qty 1

## 2022-02-04 MED ORDER — OXYCODONE HCL 5 MG PO TABS: 5.0000 mg | ORAL_TABLET | Freq: Four times a day (QID) | ORAL | 0 refills | Status: DC | PRN

## 2022-02-04 NOTE — Plan of Care (Signed)
  Problem: Coping: Goal: Level of anxiety will decrease Outcome: Progressing   Problem: Pain Managment: Goal: General experience of comfort will improve Outcome: Progressing   Problem: Safety: Goal: Ability to remain free from injury will improve Outcome: Progressing   Problem: Education: Goal: Knowledge of the prescribed therapeutic regimen will improve Outcome: Progressing Goal: Understanding of discharge needs will improve Outcome: Progressing Goal: Individualized Educational Video(s) Outcome: Progressing

## 2022-02-04 NOTE — Plan of Care (Signed)
  Problem: Education: Goal: Knowledge of General Education information will improve Description: Including pain rating scale, medication(s)/side effects and non-pharmacologic comfort measures Outcome: Progressing   Problem: Education: Goal: Knowledge of the prescribed therapeutic regimen will improve Outcome: Progressing   Problem: Activity: Goal: Ability to avoid complications of mobility impairment will improve Outcome: Progressing   Problem: Clinical Measurements: Goal: Postoperative complications will be avoided or minimized Outcome: Progressing   Problem: Pain Management: Goal: Pain level will decrease with appropriate interventions Outcome: Progressing   Problem: Skin Integrity: Goal: Will show signs of wound healing Outcome: Progressing

## 2022-02-04 NOTE — Discharge Instructions (Signed)

## 2022-02-04 NOTE — Progress Notes (Signed)
Pt needs 3 in 1 due to pain and weakness after her hip surgery. Her Pt sessions were limited due to pain and weakness and this 3 in1 will assist with her needs at home and help recover from hip surgery.

## 2022-02-04 NOTE — Evaluation (Signed)
Physical Therapy Evaluation Patient Details Name: Heidi [name] MRN: [id number] DOB: 1956-06-29 Today's Date: 02/04/2022  History of Present Illness  Pt s/p R THR  Clinical Impression  Pt s/p R THR and presents with decreased R LE strength/ROM and ++post op pain limiting functional mobility.  Pt hopes to progress to dc home with family assist - pt's dtr is available to assist until 02/09/22     Recommendations for follow up therapy are one component of a multi-disciplinary discharge planning process, led by the attending physician.  Recommendations may be updated based on patient status, additional functional criteria and insurance authorization.  Follow Up Recommendations Follow physician's recommendations for discharge plan and follow up therapies      Assistance Recommended at Discharge Frequent or constant Supervision/Assistance  Patient can return home with the following  Two people to help with walking and/or transfers;A lot of help with bathing/dressing/bathroom;Assistance with cooking/housework;Assist for transportation;Help with stairs or ramp for entrance    Equipment Recommendations Rolling walker (2 wheels)  Recommendations for Other Services  OT consult    Functional Status Assessment Patient has had a recent decline in their functional status and demonstrates the ability to make significant improvements in function in a reasonable and predictable amount of time.     Precautions / Restrictions Precautions Precautions: Fall Restrictions Weight Bearing Restrictions: No Other Position/Activity Restrictions: WBAT      Mobility  Bed Mobility Overal bed mobility: Needs Assistance Bed Mobility: Supine to Sit, Sit to Supine     Supine to sit: Mod assist, Max assist, +2 for physical assistance, +2 for safety/equipment Sit to supine: Mod assist, Max assist, +2 for physical assistance, +2 for safety/equipment   General bed mobility comments: INcreased time with cues for  sequence, assist to manage LEs and control trunk and use of bed pad to complete rotation to/from EOB    Transfers                   General transfer comment: NT 2* pain    Ambulation/Gait                  Stairs            Wheelchair Mobility    Modified Rankin (Stroke Patients Only)       Balance Overall balance assessment: Needs assistance Sitting-balance support: No upper extremity supported, Feet supported Sitting balance-Leahy Scale: Fair                                       Pertinent Vitals/Pain Pain Assessment Pain Assessment: 0-10 Pain Score: 9  Pain Location: R hip Pain Descriptors / Indicators: Aching, Burning, Cramping, Crying, Guarding, Grimacing, Sharp Pain Intervention(s): Limited activity within patient's tolerance, Monitored during session, Premedicated before session, Patient requesting pain meds-RN notified, RN gave pain meds during session, Ice applied    Home Living Family/patient expects to be discharged to:: Private residence Living Arrangements: Children Available Help at Discharge: Family Type of Home: House Home Access: Stairs to enter   Secretary/administrator of Steps: 1   Home Layout: One level Home Equipment: Rollator (4 wheels)      Prior Function Prior Level of Function : Independent/Modified Independent             Mobility Comments: using Rollator to assist       Hand Dominance        Extremity/Trunk Assessment  Upper Extremity Assessment Upper Extremity Assessment: Overall WFL for tasks assessed    Lower Extremity Assessment Lower Extremity Assessment: RLE deficits/detail RLE: Unable to fully assess due to pain    Cervical / Trunk Assessment Cervical / Trunk Assessment: Normal  Communication   Communication: No difficulties  Cognition Arousal/Alertness: Awake/alert Behavior During Therapy: WFL for tasks assessed/performed Overall Cognitive Status: Within Functional  Limits for tasks assessed                                          General Comments      Exercises     Assessment/Plan    PT Assessment Patient needs continued PT services  PT Problem List Decreased strength;Decreased range of motion;Decreased activity tolerance;Decreased balance;Decreased mobility;Decreased knowledge of use of DME;Decreased safety awareness;Pain       PT Treatment Interventions DME instruction;Gait training;Stair training;Functional mobility training;Therapeutic activities;Therapeutic exercise;Patient/family education    PT Goals (Current goals can be found in the Care Plan section)  Acute Rehab PT Goals Patient Stated Goal: Less pain; regain IND PT Goal Formulation: With patient Time For Goal Achievement: 02/16/22 Potential to Achieve Goals: Good    Frequency 7X/week     Co-evaluation               AM-PAC PT "6 Clicks" Mobility  Outcome Measure Help needed turning from your back to your side while in a flat bed without using bedrails?: A Lot Help needed moving from lying on your back to sitting on the side of a flat bed without using bedrails?: A Lot Help needed moving to and from a bed to a chair (including a wheelchair)?: A Lot Help needed standing up from a chair using your arms (e.g., wheelchair or bedside chair)?: Total Help needed to walk in hospital room?: Total Help needed climbing 3-5 steps with a railing? : Total 6 Click Score: 9    End of Session Equipment Utilized During Treatment: Gait belt Activity Tolerance: Patient limited by pain Patient left: in bed;with call bell/phone within reach;with nursing/sitter in room;with bed alarm set Nurse Communication: Mobility status PT Visit Diagnosis: Difficulty in walking, not elsewhere classified (R26.2);Pain Pain - Right/Left: Right Pain - part of body: Hip    Time: 7026-3785 PT Time Calculation (min) (ACUTE ONLY): 17 min   Charges:   PT Evaluation $PT Eval Low  Complexity: 1 Low          Mauro Kaufmann PT Acute Rehabilitation Services Pager [phone] Office [phone]   [name] 02/04/2022, 1:51 PM

## 2022-02-04 NOTE — Progress Notes (Signed)
PT Cancellation Note  Patient Details Name: Heidi [name] MRN: [id number] DOB: 09-25-1955   Cancelled Treatment:     PT deferred this pm 2* ongoing pain issues.  Will follow in am.   Kellen Dutch 02/04/2022, 4:00 PM

## 2022-02-04 NOTE — Progress Notes (Signed)
Subjective: 1 Day Post-Op Procedure(s) (LRB): RIGHT TOTAL HIP ARTHROPLASTY ANTERIOR APPROACH (Right) Patient reports pain as severe.  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  The surgery on her right hip went well yesterday.   Objective: Vital signs in last 24 hours: Temp:  [97.7 F (36.5 C)-98.8 F (37.1 C)] 98.5 F (36.9 C) (07/29 0532) Pulse Rate:  [76-102] 100 (07/29 0532) Resp:  [13-22] 18 (07/29 0532) BP: (97-165)/(51-92) 131/78 (07/29 0532) SpO2:  [90 %-100 %] 99 % (07/29 0532) Weight:  [64.3 kg] 64.3 kg (07/28 1620)  Intake/Output from previous day: 07/28 0701 - 07/29 0700 In: 3424.6 [P.O.:600; I.V.:2524.6; IV Piggyback:300] Out: 650 [Urine:450; Blood:200] Intake/Output this shift: No intake/output data recorded.  Recent Labs    02/04/22 0316  HGB 12.5   Recent Labs    02/04/22 0316  WBC 6.9  RBC 3.43*  HCT 37.8  PLT 166   No results for input(s): "NA", "K", "CL", "CO2", "BUN", "CREATININE", "GLUCOSE", "CALCIUM" in the last 72 hours. No results for input(s): "LABPT", "INR" in the last 72 hours.  Sensation intact distally Intact pulses distally Dorsiflexion/Plantar flexion intact Incision: dressing C/D/I   Assessment/Plan: 1 Day Post-Op Procedure(s) (LRB): RIGHT TOTAL HIP ARTHROPLASTY ANTERIOR APPROACH (Right) Up with therapy I will work on adjusting pain medications including adding Toradol and Neurontin.  She will be unable to go home today due to the significance of her pain.  Hopefully we can get her home in the next 1 to 2 days.  I did let her know that my plan would be to set her up for a left hip replacement sometime in the next 6 to 8 weeks or less.     Kathryne Hitch 02/04/2022, 8:38 AM

## 2022-02-04 NOTE — TOC Transition Note (Signed)
Transition of Care [facility]) - CM/SW Discharge Note   Patient Details  Name: Heidi [name] MRN: [id number] Date of Birth: September 30, 1955  [facility] CM/SW Contact:  Darleene Cleaver, LCSW Phone Number: 02/04/2022, 4:40 PM   Clinical Narrative:     CSW spoke to patient and discussed what equipment she needed.  Per patient she said she needed a rolling walker and a 3 in 1.  CSW contacted [employer] and they will deliver to patient's room before she leaves.  Patient was asked if she had a preference for home health agencies, she did not.  CSW was able to contact Victorino Dike at [facility] and they will accept patient for [facility] PT.  CSW signing off, please reconsult if other social work needs arise.   Final next level of care: Home w Home Health Services Barriers to Discharge: Continued Medical Work up   Patient Goals and CMS Choice Patient states their goals for this hospitalization and ongoing recovery are:: To return back home with home health. CMS Medicare.gov Compare Post Acute Care list provided to:: Patient Choice offered to / list presented to : Patient  Discharge Placement                       Discharge Plan and Services     Post Acute Care Choice: Durable Medical Equipment, Home Health          DME Arranged: Walker rolling, 3-N-1 DME Agency: [employer] Date DME Agency Contacted: 02/04/22 Time DME Agency Contacted: 1430 Representative spoke with at DME Agency: Leavy Cella HH Arranged: PT HH Agency: [facility] Date [facility] Agency Contacted: 02/04/22 Time HH Agency Contacted: 1130 Representative spoke with at [facility] Agency: Victorino Dike  Social Determinants of Health (SDOH) Interventions     Readmission Risk Interventions     No data to display

## 2022-02-04 NOTE — Progress Notes (Deleted)
"  Pt needs a 3 in 1 due to urinary frequency and urgency as well as weakness after surgery. Her  PT sessions were limited due to pain and weakness after surgeryand pt will need this aide to help with her progression. This 3 in 1 will assist her with needs at home to recover from knee surgery."

## 2022-02-05 NOTE — Plan of Care (Signed)
  Problem: Clinical Measurements: Goal: Diagnostic test results will improve Outcome: Progressing   Problem: Clinical Measurements: Goal: Cardiovascular complication will be avoided Outcome: Progressing   Problem: Nutrition: Goal: Adequate nutrition will be maintained Outcome: Progressing   Problem: Activity: Goal: Risk for activity intolerance will decrease Outcome: Progressing   Problem: Skin Integrity: Goal: Risk for impaired skin integrity will decrease Outcome: Progressing

## 2022-02-05 NOTE — Progress Notes (Signed)
     Subjective: 2 Days Post-Op Procedure(s) (LRB): RIGHT TOTAL HIP ARTHROPLASTY ANTERIOR APPROACH (Right) Awake alert and oriented x 4. Has special needs daughter at home and this  Significantly increases her concern about being able to help her when she goes home. A sister is here from [place] to help but there  is still concern and social service may be able to help.  Patient reports pain as marked.    Objective:   VITALS:  Temp:  [98 F (36.7 C)-98.8 F (37.1 C)] 98.8 F (37.1 C) (07/30 0534) Pulse Rate:  [90-97] 97 (07/30 0534) Resp:  [18] 18 (07/30 0534) BP: (128-148)/(80-90) 141/90 (07/30 0534) SpO2:  [95 %-97 %] 97 % (07/30 0534)  Neurologically intact ABD soft Neurovascular intact Sensation intact distally Intact pulses distally Dorsiflexion/Plantar flexion intact Incision: dressing C/D/I and scant drainage   LABS Recent Labs    02/04/22 0316  HGB 12.5  WBC 6.9  PLT 166   No results for input(s): "NA", "K", "CL", "CO2", "BUN", "CREATININE", "GLUCOSE" in the last 72 hours. No results for input(s): "LABPT", "INR" in the last 72 hours.   Assessment/Plan: 2 Days Post-Op Procedure(s) (LRB): RIGHT TOTAL HIP ARTHROPLASTY ANTERIOR APPROACH (Right)  Advance diet Up with therapy D/C IV fluids Discharge home with home health Discussion with PT she is slow in therapy and only able to ambulate short distances.  Will keep until more independent and have social service assess her situation at home.   Vira Browns 02/05/2022, 10:39 AM Patient ID: Heidi [name], female   DOB: 01/22/1956, 66 y.o.   MRN: [id number]

## 2022-02-05 NOTE — Progress Notes (Signed)
Physical Therapy Treatment Patient Details Name: Heidi [name] MRN: [id number] DOB: 12-24-1955 Today's Date: 02/05/2022   History of Present Illness Pt s/p R THR    PT Comments    Pt continues cooperative but progressing very slowly with mobility 2* pain control issues.  This date, pt tolerated stepping bed<>chair and up in chair ~ one hour.  Pt requiring significant assist for all basic mobility tasks.   Recommendations for follow up therapy are one component of a multi-disciplinary discharge planning process, led by the attending physician.  Recommendations may be updated based on patient status, additional functional criteria and insurance authorization.  Follow Up Recommendations  Follow physician's recommendations for discharge plan and follow up therapies     Assistance Recommended at Discharge Frequent or constant Supervision/Assistance  Patient can return home with the following Two people to help with walking and/or transfers;A lot of help with bathing/dressing/bathroom;Assistance with cooking/housework;Assist for transportation;Help with stairs or ramp for entrance   Equipment Recommendations  Rolling walker (2 wheels)    Recommendations for Other Services OT consult     Precautions / Restrictions Precautions Precautions: Fall Restrictions Weight Bearing Restrictions: No RLE Weight Bearing: Weight bearing as tolerated Other Position/Activity Restrictions: WBAT     Mobility  Bed Mobility Overal bed mobility: Needs Assistance Bed Mobility: Sit to Supine     Supine to sit: Mod assist, Max assist, +2 for physical assistance, +2 for safety/equipment Sit to supine: Mod assist, Max assist, +2 for physical assistance, +2 for safety/equipment   General bed mobility comments: INcreased time with cues for sequence, assist to manage LEs and control trunk and use of bed pad to complete rotation to/from EOB    Transfers Overall transfer level: Needs assistance Equipment  used: Rolling walker (2 wheels) Transfers: Sit to/from Stand, Bed to chair/wheelchair/BSC Sit to Stand: Min assist, Mod assist, +2 safety/equipment, +2 physical assistance, From elevated surface   Step pivot transfers: Min assist, +2 physical assistance, +2 safety/equipment       General transfer comment: cues for LE management and use of UEs to self assist    Ambulation/Gait Ambulation/Gait assistance: Min assist, +2 physical assistance, +2 safety/equipment Gait Distance (Feet): 2 Feet Assistive device: Rolling walker (2 wheels) Gait Pattern/deviations: Step-to pattern, Decreased step length - right, Decreased step length - left, Shuffle, Trunk flexed, Narrow base of support Gait velocity: decr     General Gait Details: Increased time with cues for sequence, posture and position from [employer]             Wheelchair Mobility    Modified Rankin (Stroke Patients Only)       Balance Overall balance assessment: Needs assistance Sitting-balance support: No upper extremity supported, Feet supported Sitting balance-Leahy Scale: Fair     Standing balance support: Bilateral upper extremity supported Standing balance-Leahy Scale: Poor                              Cognition Arousal/Alertness: Awake/alert Behavior During Therapy: WFL for tasks assessed/performed Overall Cognitive Status: Within Functional Limits for tasks assessed                                          Exercises Total Joint Exercises Ankle Circles/Pumps: AROM, Both, 15 reps, Supine Quad Sets: AROM, Both, 10 reps, Supine Heel Slides: AAROM, Right, 10 reps,  Supine Hip ABduction/ADduction: AAROM, Right, 10 reps, Supine    General Comments        Pertinent Vitals/Pain Pain Assessment Pain Assessment: 0-10 Pain Score: 8  Pain Location: R hip Pain Descriptors / Indicators: Aching, Burning, Cramping, Crying, Guarding, Grimacing, Sharp Pain Intervention(s): Limited  activity within patient's tolerance, Monitored during session, Premedicated before session, Ice applied, Patient requesting pain meds-RN notified    Home Living                          Prior Function            PT Goals (current goals can now be found in the care plan section) Acute Rehab PT Goals Patient Stated Goal: Less pain; regain IND PT Goal Formulation: With patient Time For Goal Achievement: 02/16/22 Potential to Achieve Goals: Good Progress towards PT goals: Progressing toward goals    Frequency    7X/week      PT Plan Current plan remains appropriate    Co-evaluation              AM-PAC PT "6 Clicks" Mobility   Outcome Measure  Help needed turning from your back to your side while in a flat bed without using bedrails?: A Lot Help needed moving from lying on your back to sitting on the side of a flat bed without using bedrails?: A Lot Help needed moving to and from a bed to a chair (including a wheelchair)?: A Lot Help needed standing up from a chair using your arms (e.g., wheelchair or bedside chair)?: Total Help needed to walk in hospital room?: Total Help needed climbing 3-5 steps with a railing? : Total 6 Click Score: 9    End of Session Equipment Utilized During Treatment: Gait belt Activity Tolerance: Patient limited by pain Patient left: in bed;with call bell/phone within reach;with bed alarm set;with nursing/sitter in room Nurse Communication: Mobility status PT Visit Diagnosis: Difficulty in walking, not elsewhere classified (R26.2);Pain Pain - Right/Left: Right Pain - part of body: Hip     Time: 9470-9628 PT Time Calculation (min) (ACUTE ONLY): 15 min  Charges:  $Therapeutic Exercise: 8-22 mins $Therapeutic Activity: 8-22 mins                     Mauro Kaufmann PT Acute Rehabilitation Services Pager [phone] Office [phone]    [facility] 02/05/2022, 3:39 PM

## 2022-02-05 NOTE — Progress Notes (Signed)
Physical Therapy Treatment Patient Details Name: Heidi [name] MRN: [id number] DOB: 10/24/1955 Today's Date: 02/05/2022   History of Present Illness Pt s/p R THR    PT Comments    Pt continues pain limited but with small improvements in activity tolerance this date - pt able to perform limited therex program and up to standing to transfer to chair for first time.  Pt requesting additional pain meds and RN notified.   Recommendations for follow up therapy are one component of a multi-disciplinary discharge planning process, led by the attending physician.  Recommendations may be updated based on patient status, additional functional criteria and insurance authorization.  Follow Up Recommendations  Follow physician's recommendations for discharge plan and follow up therapies     Assistance Recommended at Discharge Frequent or constant Supervision/Assistance  Patient can return home with the following Two people to help with walking and/or transfers;A lot of help with bathing/dressing/bathroom;Assistance with cooking/housework;Assist for transportation;Help with stairs or ramp for entrance   Equipment Recommendations  Rolling walker (2 wheels)    Recommendations for Other Services OT consult     Precautions / Restrictions Precautions Precautions: Fall Restrictions Weight Bearing Restrictions: No RLE Weight Bearing: Weight bearing as tolerated Other Position/Activity Restrictions: WBAT     Mobility  Bed Mobility Overal bed mobility: Needs Assistance Bed Mobility: Supine to Sit     Supine to sit: Mod assist, Max assist, +2 for physical assistance, +2 for safety/equipment     General bed mobility comments: INcreased time with cues for sequence, assist to manage LEs and control trunk and use of bed pad to complete rotation to/from EOB    Transfers Overall transfer level: Needs assistance   Transfers: Sit to/from Stand, Bed to chair/wheelchair/BSC Sit to Stand: Min assist, Mod  assist, +2 safety/equipment, +2 physical assistance, From elevated surface   Step pivot transfers: Min assist, +2 physical assistance, +2 safety/equipment       General transfer comment: cues for LE management and use of UEs to self assist    Ambulation/Gait Ambulation/Gait assistance: Min assist, +2 physical assistance, +2 safety/equipment Gait Distance (Feet): 2 Feet Assistive device: Rolling walker (2 wheels) Gait Pattern/deviations: Step-to pattern, Decreased step length - right, Decreased step length - left, Shuffle, Trunk flexed, Narrow base of support Gait velocity: decr     General Gait Details: Increased time with cues for sequence, posture and position from [employer]             Wheelchair Mobility    Modified Rankin (Stroke Patients Only)       Balance Overall balance assessment: Needs assistance Sitting-balance support: No upper extremity supported, Feet supported Sitting balance-Leahy Scale: Fair     Standing balance support: Bilateral upper extremity supported Standing balance-Leahy Scale: Poor                              Cognition Arousal/Alertness: Awake/alert Behavior During Therapy: WFL for tasks assessed/performed Overall Cognitive Status: Within Functional Limits for tasks assessed                                          Exercises Total Joint Exercises Ankle Circles/Pumps: AROM, Both, 15 reps, Supine Quad Sets: AROM, Both, 10 reps, Supine Heel Slides: AAROM, Right, 10 reps, Supine Hip ABduction/ADduction: AAROM, Right, 10 reps, Supine    General  Comments        Pertinent Vitals/Pain Pain Assessment Pain Assessment: 0-10 Pain Score: 9  Pain Location: R hip Pain Descriptors / Indicators: Aching, Burning, Cramping, Crying, Guarding, Grimacing, Sharp Pain Intervention(s): Limited activity within patient's tolerance, Monitored during session, Premedicated before session, Patient requesting pain  meds-RN notified, Ice applied    Home Living                          Prior Function            PT Goals (current goals can now be found in the care plan section) Acute Rehab PT Goals Patient Stated Goal: Less pain; regain IND PT Goal Formulation: With patient Time For Goal Achievement: 02/16/22 Potential to Achieve Goals: Good Progress towards PT goals: Progressing toward goals    Frequency    7X/week      PT Plan Current plan remains appropriate    Co-evaluation              AM-PAC PT "6 Clicks" Mobility   Outcome Measure  Help needed turning from your back to your side while in a flat bed without using bedrails?: A Lot Help needed moving from lying on your back to sitting on the side of a flat bed without using bedrails?: A Lot Help needed moving to and from a bed to a chair (including a wheelchair)?: A Lot Help needed standing up from a chair using your arms (e.g., wheelchair or bedside chair)?: Total Help needed to walk in hospital room?: Total Help needed climbing 3-5 steps with a railing? : Total 6 Click Score: 9    End of Session Equipment Utilized During Treatment: Gait belt Activity Tolerance: Patient limited by pain Patient left: in chair;with call bell/phone within reach;with chair alarm set Nurse Communication: Mobility status PT Visit Diagnosis: Difficulty in walking, not elsewhere classified (R26.2);Pain Pain - Right/Left: Right Pain - part of body: Hip     Time: 3007-6226 PT Time Calculation (min) (ACUTE ONLY): 27 min  Charges:  $Therapeutic Exercise: 8-22 mins $Therapeutic Activity: 8-22 mins                     Mauro Kaufmann PT Acute Rehabilitation Services Pager [phone] Office [phone]    [facility] 02/05/2022, 12:38 PM

## 2022-02-06 ENCOUNTER — Other Ambulatory Visit: Payer: Self-pay

## 2022-02-06 ENCOUNTER — Telehealth: Payer: Self-pay | Admitting: Physician Assistant

## 2022-02-06 ENCOUNTER — Encounter (HOSPITAL_COMMUNITY): Payer: Self-pay | Admitting: Orthopaedic Surgery

## 2022-02-06 MED ORDER — GABAPENTIN 100 MG PO CAPS: 100.0000 mg | ORAL_CAPSULE | Freq: Three times a day (TID) | ORAL | 1 refills | Status: DC

## 2022-02-06 MED ORDER — HYDROMORPHONE HCL 4 MG PO TABS: 4.0000 mg | ORAL_TABLET | ORAL | 0 refills | Status: DC | PRN

## 2022-02-06 NOTE — Progress Notes (Signed)
Physical Therapy Treatment Patient Details Name: Heidi [name] MRN: [id number] DOB: 10-24-1955 Today's Date: 02/06/2022   History of Present Illness 66 y.o. 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 Failed conservative management. PMHx significant for arthritis and asthma.    PT Comments    Pt is cooperative with PT, able to tolerate sitting up in recliner for  1.5 hours today.  I have encouraged pt to work on hip flexion exercises after she gets her next dose of pain meds. Reviewed ankle pumps and quad sets which pt tol well.  We have discussed goals for tomorrow of pain control and incr ambulation distance.  Pt is in agreement with this plan.  Pt reports she would like to go home tomorrow if possible. Overall pt is mobilizing with min/guard assist however she is not yet ambulating a functional, household distance. Once this is accomplished feel pt can likely d/c home.   Recommendations for follow up therapy are one component of a multi-disciplinary discharge planning process, led by the attending physician.  Recommendations may be updated based on patient status, additional functional criteria and insurance authorization.  Follow Up Recommendations  Follow physician's recommendations for discharge plan and follow up therapies     Assistance Recommended at Discharge Frequent or constant Supervision/Assistance  Patient can return home with the following Assistance with cooking/housework;Assist for transportation;Help with stairs or ramp for entrance;A little help with walking and/or transfers;A little help with bathing/dressing/bathroom   Equipment Recommendations  Rolling walker (2 wheels)    Recommendations for Other Services       Precautions / Restrictions Precautions Precautions: Fall Restrictions Weight Bearing Restrictions: No Other Position/Activity Restrictions: WBAT     Mobility  Bed Mobility Overal bed  mobility: Needs Assistance Bed Mobility: Sit to Supine     Supine to sit: Min guard Sit to supine: Min assist   General bed mobility comments: Increased time with cues for sequence, very light min assist to manage RLE on to bed    Transfers Overall transfer level: Needs assistance Equipment used: Rolling walker (2 wheels) Transfers: Sit to/from Stand, Bed to chair/wheelchair/BSC Sit to Stand: Min guard   Step pivot transfers: Min guard       General transfer comment: cues for LE management and use of UEs to self assist and control descent    Ambulation/Gait Ambulation/Gait assistance: Min assist, Min guard Gait Distance (Feet): 10 Feet Assistive device: Rolling walker (2 wheels) Gait Pattern/deviations: Step-to pattern, Decreased step length - right, Decreased step length - left, Trunk flexed, Narrow base of support, Decreased stance time - right, Decreased weight shift to right Gait velocity: decr     General Gait Details: Increased time with cues for sequence, posture and position from [employer]             Wheelchair Mobility    Modified Rankin (Stroke Patients Only)       Balance Overall balance assessment: Needs assistance Sitting-balance support: No upper extremity supported, Feet supported Sitting balance-Leahy Scale: Good     Standing balance support: Bilateral upper extremity supported Standing balance-Leahy Scale: Poor                              Cognition Arousal/Alertness: Awake/alert Behavior During Therapy: WFL for tasks assessed/performed Overall Cognitive Status: Within Functional Limits for tasks assessed  Exercises      General Comments        Pertinent Vitals/Pain Pain Assessment Pain Assessment: 0-10 Pain Score: 4  Pain Location: R hip Pain Descriptors / Indicators: Aching, Burning, Cramping, Crying, Guarding, Grimacing, Sharp Pain Intervention(s):  Limited activity within patient's tolerance, Monitored during session, Premedicated before session, Repositioned, Ice applied, Utilized relaxation techniques, Patient requesting pain meds-RN notified    Home Living                          Prior Function            PT Goals (current goals can now be found in the care plan section) Acute Rehab PT Goals Patient Stated Goal: Less pain; regain IND PT Goal Formulation: With patient Time For Goal Achievement: 02/16/22 Potential to Achieve Goals: Good Progress towards PT goals: Progressing toward goals    Frequency    7X/week      PT Plan Current plan remains appropriate    Co-evaluation              AM-PAC PT "6 Clicks" Mobility   Outcome Measure  Help needed turning from your back to your side while in a flat bed without using bedrails?: A Little Help needed moving from lying on your back to sitting on the side of a flat bed without using bedrails?: A Little Help needed moving to and from a bed to a chair (including a wheelchair)?: A Little Help needed standing up from a chair using your arms (e.g., wheelchair or bedside chair)?: A Little Help needed to walk in hospital room?: A Lot Help needed climbing 3-5 steps with a railing? : A Lot 6 Click Score: 16    End of Session Equipment Utilized During Treatment: Gait belt Activity Tolerance: Patient limited by pain;Patient limited by fatigue Patient left: in bed;with call bell/phone within reach;with chair alarm set Nurse Communication: Mobility status PT Visit Diagnosis: Difficulty in walking, not elsewhere classified (R26.2);Pain Pain - Right/Left: Right Pain - part of body: Hip     Time: 2585-2778 PT Time Calculation (min) (ACUTE ONLY): 14 min  Charges:  $Therapeutic Activity: 8-22 mins                     Delice Bison, PT  Acute Rehab Dept [facility]) [phone]  [facility] Weekend Pager [facility] only)   [phone]  02/06/2022    [facility] 02/06/2022, 5:06 PM

## 2022-02-06 NOTE — Telephone Encounter (Signed)
Heather (pharmacist) from [facility] called with questions about pain medication. We prescribed on 7/29 of oxycodone and pt has not picked up. We also prescribe hydromorphone. Pharmacist wants to know to fill both scripts? Also pharmacist states that the hydromorphone need to be no more than 3 a day and not 6. Script exceeds dosage daily. Please call Heather at [phone].

## 2022-02-06 NOTE — Telephone Encounter (Signed)
Please advise 

## 2022-02-06 NOTE — Progress Notes (Signed)
Patient ID: Heidi [name], female   DOB: [birth year], 66 y.o.   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  Her vital signs are stable.  I did change her right hip dressing and looks good.  It is still not safe from a mobility standpoint to let her go home today.  We will continue to follow her closely and PT will work with her until she is safe for discharge to home.

## 2022-02-06 NOTE — Plan of Care (Signed)
  Problem: Coping: Goal: Level of anxiety will decrease Outcome: Progressing   Problem: Pain Managment: Goal: General experience of comfort will improve Outcome: Progressing   Problem: Safety: Goal: Ability to remain free from injury will improve Outcome: Progressing   

## 2022-02-06 NOTE — Progress Notes (Signed)
Physical Therapy Treatment Patient Details Name: Heidi [name] MRN: [id number] DOB: [birth year] Today's Date: 02/06/2022   History of Present Illness 66 y.o. 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 Failed conservative management. PMHx significant for arthritis and asthma.    PT Comments    Pt is progressing today, still having issues with pain but improved activity tolerance overall. Able to amb short distance in room with min/guard. Will continue efforts to increase activity as tolerated.  Recommendations for follow up therapy are one component of a multi-disciplinary discharge planning process, led by the attending physician.  Recommendations may be updated based on patient status, additional functional criteria and insurance authorization.  Follow Up Recommendations  Follow physician's recommendations for discharge plan and follow up therapies     Assistance Recommended at Discharge Frequent or constant Supervision/Assistance  Patient can return home with the following Assistance with cooking/housework;Assist for transportation;Help with stairs or ramp for entrance;A little help with walking and/or transfers;A little help with bathing/dressing/bathroom   Equipment Recommendations  Rolling walker (2 wheels)    Recommendations for Other Services       Precautions / Restrictions Precautions Precautions: Fall Restrictions Weight Bearing Restrictions: No Other Position/Activity Restrictions: WBAT     Mobility  Bed Mobility Overal bed mobility: Needs Assistance Bed Mobility: Supine to Sit     Supine to sit: Min guard     General bed mobility comments: INcreased time with cues for sequence, assist to manage RLE    Transfers Overall transfer level: Needs assistance Equipment used: Rolling walker (2 wheels) Transfers: Sit to/from Stand, Bed to chair/wheelchair/BSC Sit to Stand: Min guard           General  transfer comment: cues for LE management and use of UEs to self assist and control descent    Ambulation/Gait Ambulation/Gait assistance: Min assist, Min guard Gait Distance (Feet): 10 Feet Assistive device: Rolling walker (2 wheels) Gait Pattern/deviations: Step-to pattern, Decreased step length - right, Decreased step length - left, Trunk flexed, Narrow base of support, Decreased stance time - right, Decreased weight shift to right Gait velocity: decr     General Gait Details: Increased time with cues for sequence, posture and position from [employer]             Wheelchair Mobility    Modified Rankin (Stroke Patients Only)       Balance Overall balance assessment: Needs assistance Sitting-balance support: No upper extremity supported, Feet supported Sitting balance-Leahy Scale: Good     Standing balance support: Bilateral upper extremity supported Standing balance-Leahy Scale: Poor                              Cognition Arousal/Alertness: Awake/alert Behavior During Therapy: WFL for tasks assessed/performed Overall Cognitive Status: Within Functional Limits for tasks assessed                                          Exercises      General Comments        Pertinent Vitals/Pain Pain Assessment Pain Assessment: 0-10 Pain Location: R hip Pain Descriptors / Indicators: Aching, Burning, Cramping, Crying, Guarding, Grimacing, Sharp    Home Living Family/patient expects to be discharged to:: Private residence Living Arrangements: Children Available Help at Discharge: Family Type of Home:  House Home Access: Stairs to enter   [employer] of Steps: 1   Home Layout: One level Home Equipment: Rollator (4 wheels);Shower seat - built in      Prior Function            PT Goals (current goals can now be found in the care plan section) Acute Rehab PT Goals Patient Stated Goal: Less pain; regain IND PT Goal  Formulation: With patient Time For Goal Achievement: 02/16/22 Potential to Achieve Goals: Good Progress towards PT goals: Progressing toward goals    Frequency    7X/week      PT Plan Current plan remains appropriate    Co-evaluation              AM-PAC PT "6 Clicks" Mobility   Outcome Measure  Help needed turning from your back to your side while in a flat bed without using bedrails?: A Little Help needed moving from lying on your back to sitting on the side of a flat bed without using bedrails?: A Little Help needed moving to and from a bed to a chair (including a wheelchair)?: A Little Help needed standing up from a chair using your arms (e.g., wheelchair or bedside chair)?: A Little Help needed to walk in hospital room?: A Lot Help needed climbing 3-5 steps with a railing? : A Lot 6 Click Score: 16    End of Session Equipment Utilized During Treatment: Gait belt Activity Tolerance: Patient limited by pain;Patient limited by fatigue Patient left: in bed;with call bell/phone within reach;with chair alarm set Nurse Communication: Mobility status PT Visit Diagnosis: Difficulty in walking, not elsewhere classified (R26.2);Pain Pain - Right/Left: Right Pain - part of body: Hip     Time: 5027-7412 PT Time Calculation (min) (ACUTE ONLY): 14 min  Charges:  $Gait Training: 8-22 mins                     Delice Bison, PT  Acute Rehab Dept [facility]) [phone]  [facility] Weekend Pager [facility] only)  [phone]  02/06/2022    [facility] 02/06/2022, 1:58 PM

## 2022-02-06 NOTE — Evaluation (Addendum)
Occupational Therapy Evaluation Patient Details Name: Heidi [name] MRN: [id number] DOB: 1955-12-11 Today's Date: 02/06/2022   History of Present Illness 66 y.o. 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 Failed conservative management. PMHx significant for arthritis and asthma.   Clinical Impression   PTA patient was living in a private residence and was the primary caregiver for her daughter that has special needs. Patient was grossly Mod I with ADLs/IADLs with use of rollator x3 years. Patient currently functioning below baseline requiring set-up A for UB ADLs, Max A for LB ADLs and Min guard for sit to stand and functional transfers with use of RW. Requires cues for safe hand placement. Patient also limited by deficits listed below including pain with movement, decreased activity tolerance and decreased dynamic standing balance and would benefit from continued acute OT services in prep for safe d/c home. Pending patient progress she may benefit from follow-up HHOT to maximize safety/independence with self-care tasks and decrease risk of falls. OT will continue to follow acutely.        Recommendations for follow up therapy are one component of a multi-disciplinary discharge planning process, led by the attending physician.  Recommendations may be updated based on patient status, additional functional criteria and insurance authorization.   Follow Up Recommendations  Follow physician's recommendations for discharge plan and follow up therapies    Assistance Recommended at Discharge Frequent or constant Supervision/Assistance  Patient can return home with the following A little help with walking and/or transfers;A lot of help with bathing/dressing/bathroom;Assist for transportation;Assistance with cooking/housework    Functional Status Assessment  Patient has had a recent decline in their functional status and demonstrates the ability  to make significant improvements in function in a reasonable and predictable amount of time.  Equipment Recommendations  Other (comment) (TBD)    Recommendations for Other Services       Precautions / Restrictions Precautions Precautions: Fall Restrictions Weight Bearing Restrictions: Yes Other Position/Activity Restrictions: WBAT      Mobility Bed Mobility Overal bed mobility: Needs Assistance Bed Mobility: Supine to Sit, Sit to Supine     Supine to sit: Min guard Sit to supine: Min assist   General bed mobility comments: Min guard for supine to EOB with HOB elevated and use of bed features. Able to advance RLE from bed surface to EOB with increased time/effort. Facial grimacing throughout.    Transfers Overall transfer level: Needs assistance Equipment used: Rolling walker (2 wheels) Transfers: Sit to/from Stand Sit to Stand: Min guard, From elevated surface           General transfer comment: Min guard for sit to stand from EOB with Min guard to RW. Cues for hand placement. Refused further mobility but in agreement with taking lateral steps toward [facility] with Min guard and use of RW.      Balance Overall balance assessment: Needs assistance Sitting-balance support: No upper extremity supported, Feet supported Sitting balance-Leahy Scale: Good     Standing balance support: Bilateral upper extremity supported, Reliant on assistive device for balance, During functional activity Standing balance-Leahy Scale: Poor                             ADL either performed or assessed with clinical judgement   ADL Overall ADL's : Needs assistance/impaired  Vision   Vision Assessment?: No apparent visual deficits     Perception     Praxis      Pertinent Vitals/Pain Pain Assessment Pain Assessment: 0-10 Pain Score: 8  Pain Location: R hip Pain Descriptors / Indicators: Aching, Burning,  Cramping, Crying, Guarding, Grimacing, Sharp Pain Intervention(s): Limited activity within patient's tolerance     Hand Dominance Right   Extremity/Trunk Assessment     Lower Extremity Assessment RLE: Unable to fully assess due to pain   Cervical / Trunk Assessment Cervical / Trunk Assessment: Normal   Communication Communication Communication: No difficulties   Cognition Arousal/Alertness: Awake/alert Behavior During Therapy: WFL for tasks assessed/performed Overall Cognitive Status: Within Functional Limits for tasks assessed                                       General Comments       Exercises     Shoulder Instructions      Home Living Family/patient expects to be discharged to:: Private residence Living Arrangements: Children Available Help at Discharge: Family Type of Home: House Home Access: Stairs to enter Secretary/administrator of Steps: 1   Home Layout: One level     Bathroom Shower/Tub: Producer, television/film/video: Standard     Home Equipment: Rollator (4 wheels);Shower seat - built in          Prior Functioning/Environment Prior Level of Function : Independent/Modified Independent             Mobility Comments: Rollator x3 year in home and community dwellings ADLs Comments: Mod I        OT Problem List: Decreased strength;Decreased activity tolerance;Impaired balance (sitting and/or standing);Decreased safety awareness;Decreased knowledge of use of DME or AE;Pain      OT Treatment/Interventions: Self-care/ADL training;Therapeutic exercise;Energy conservation;DME and/or AE instruction;Therapeutic activities;Patient/family education;Balance training    OT Goals(Current goals can be found in the care plan section) Acute Rehab OT Goals Patient Stated Goal: To return home. OT Goal Formulation: With patient Time For Goal Achievement: 02/20/22 Potential to Achieve Goals: Good ADL Goals Pt Will Perform Grooming:  with modified independence;standing Pt Will Perform Upper Body Dressing: with modified independence;sitting Pt Will Perform Lower Body Dressing: with modified independence;sit to/from stand Pt Will Transfer to Toilet: with modified independence;ambulating;regular height toilet Pt Will Perform Toileting - Clothing Manipulation and hygiene: with modified independence;sit to/from stand Pt Will Perform Tub/Shower Transfer: with modified independence;Shower transfer;3 in Actuary  OT Frequency: Min 2X/week    Co-evaluation              AM-PAC OT "6 Clicks" Daily Activity     Outcome Measure Help from another person eating meals?: None Help from another person taking care of personal grooming?: A Little Help from another person toileting, which includes using toliet, bedpan, or urinal?: A Little Help from another person bathing (including washing, rinsing, drying)?: A Little Help from another person to put on and taking off regular upper body clothing?: A Little Help from another person to put on and taking off regular lower body clothing?: A Lot 6 Click Score: 18   End of Session Equipment Utilized During Treatment: Gait belt;Rolling walker (2 wheels) Nurse Communication: Mobility status  Activity Tolerance: Patient limited by pain Patient left: in bed;with call bell/phone within reach;with bed alarm set  OT Visit Diagnosis: Unsteadiness on feet (R26.81);Other abnormalities of gait and mobility (  R26.89);Muscle weakness (generalized) (M62.81);Pain                Time: 1204-1220 OT Time Calculation (min): 16 min Charges:  OT General Charges $OT Visit: 1 Visit OT Evaluation $OT Eval Moderate Complexity: 1 Mod  Arley Garant H. OTR/L Supplemental OT, Department of rehab services [phone]  Toriana Sponsel R H. 02/06/2022, 1:26 PM

## 2022-02-06 NOTE — Telephone Encounter (Signed)
Called and advised.

## 2022-02-07 NOTE — Progress Notes (Signed)
Discharge instructions given to patient. Patient verbalizes understanding.

## 2022-02-07 NOTE — Care Management Important Message (Signed)
Important Message  Patient Details IM Letter given to the Patient. Name: Heidi [name] MRN: [id number] Date of Birth: 10/31/1955   Medicare Important Message Given:  Yes     Caren Macadam 02/07/2022, 11:15 AM

## 2022-02-07 NOTE — Progress Notes (Signed)
Patient ID: Heidi [name], female   DOB: Oct 27, 1955, 66 y.o.   MRN: [id number] No acute changes.  Operative right hip stable.  The plan is to discharge to home today.

## 2022-02-07 NOTE — TOC Progression Note (Signed)
Transition of Care [facility]) - Progression Note   Patient Details  Name: Heidi [name] MRN: [id number] Date of Birth: 02/11/1956  Transition of Care [facility]) CM/SW Contact  Ewing Schlein, LCSW Phone Number: 02/07/2022, 11:32 AM  Clinical Narrative: Patient will need a youth rolling walker rather than the regular size. [employer] does not have a youth RW onsite, so patient will take the walker home today and [employer] will deliver a youth RW to the home today and swap it with the one the patient was provided. CSW updated patient that she will need to take the RW home and change it out today when the other is delivered. CSW also notified patient that although TOC was consulted for patient needing assistance at home with her special needs daughter after discharge, TOC is not able to provide that assistance and arrangements would need to have been made prior to surgery as patient's surgery was prearranged.  Expected Discharge Plan: Home w Home Health Services Barriers to Discharge: Continued Medical Work up  Expected Discharge Plan and Services Expected Discharge Plan: Home w Home Health Services Post Acute Care Choice: Durable Medical Equipment, Home Health Expected Discharge Date: 02/07/22               DME Arranged: Dan Humphreys rolling, 3-N-1 DME Agency: [employer] Date DME Agency Contacted: 02/04/22 Time DME Agency Contacted: 1430 Representative spoke with at DME Agency: Leavy Cella HH Arranged: PT HH Agency: [facility] Date HH Agency Contacted: 02/04/22 Time HH Agency Contacted: 1130 Representative spoke with at [facility] Agency: Victorino Dike  Readmission Risk Interventions     No data to display

## 2022-02-07 NOTE — Plan of Care (Signed)
  Problem: Pain Managment: Goal: General experience of comfort will improve Outcome: Progressing   Problem: Safety: Goal: Ability to remain free from injury will improve Outcome: Progressing   

## 2022-02-07 NOTE — Plan of Care (Signed)
  Problem: Coping: Goal: Level of anxiety will decrease Outcome: Progressing   Problem: Pain Managment: Goal: General experience of comfort will improve Outcome: Progressing   Problem: Education: Goal: Knowledge of the prescribed therapeutic regimen will improve Outcome: Progressing Goal: Understanding of discharge needs will improve Outcome: Progressing Goal: Individualized Educational Video(s) Outcome: Progressing

## 2022-02-07 NOTE — Progress Notes (Signed)
Physical Therapy Treatment Patient Details Name: Heidi [name] MRN: [id number] DOB: 1955-12-29 Today's Date: 02/07/2022   History of Present Illness 66 y.o. 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 Failed conservative management. PMHx significant for arthritis and asthma.    PT Comments    Pt continues to progress with PT, still having some issues with pain however able to mobilize with increasing independence despite issues. Able to amb in hallway and ascend/descend single step to simulate home. Pt reports her older dtr will be with her until Thursday to assist her and help with her special needs dtr as well.  Ready for d/c with family assist as needed from PT standpoint.    Recommendations for follow up therapy are one component of a multi-disciplinary discharge planning process, led by the attending physician.  Recommendations may be updated based on patient status, additional functional criteria and insurance authorization.  Follow Up Recommendations  Follow physician's recommendations for discharge plan and follow up therapies     Assistance Recommended at Discharge Frequent or constant Supervision/Assistance  Patient can return home with the following Assistance with cooking/housework;Assist for transportation;Help with stairs or ramp for entrance;A little help with walking and/or transfers;A little help with bathing/dressing/bathroom   Equipment Recommendations  Rolling walker (2 wheels)    Recommendations for Other Services       Precautions / Restrictions Precautions Precautions: Fall Restrictions Weight Bearing Restrictions: No Other Position/Activity Restrictions: WBAT     Mobility  Bed Mobility Overal bed mobility: Needs Assistance Bed Mobility: Sit to Supine, Supine to Sit     Supine to sit: Min guard, HOB elevated Sit to supine: Min assist   General bed mobility comments: Increased time with cues for  sequence, very light min assist to manage RLE on to bed. pt instructed in use of gait belt as leg lifter    Transfers Overall transfer level: Needs assistance Equipment used: Rolling walker (2 wheels) Transfers: Sit to/from Stand, Bed to chair/wheelchair/BSC Sit to Stand: Supervision   Step pivot transfers: Supervision       General transfer comment: supervision for safety, pt self cues correct technique    Ambulation/Gait Ambulation/Gait assistance: Supervision Gait Distance (Feet): 45 Feet Assistive device: Rolling walker (2 wheels) Gait Pattern/deviations: Step-to pattern, Decreased stance time - right Gait velocity: decr     General Gait Details: cues for initial sequence, incr wt shift to RLE, beginning step through gait. decr velocity but steady with RW   Stairs Stairs: Yes Stairs assistance: Min assist, Min guard Stair Management: Step to pattern, With walker, Forwards Number of Stairs: 1 General stair comments: verbal cues for technique, safety. light assist to maneuver RW and steady ascending and descending 1 step   Wheelchair Mobility    Modified Rankin (Stroke Patients Only)       Balance Overall balance assessment: Needs assistance Sitting-balance support: No upper extremity supported, Feet supported Sitting balance-Leahy Scale: Good     Standing balance support: Bilateral upper extremity supported Standing balance-Leahy Scale: Poor                              Cognition Arousal/Alertness: Awake/alert Behavior During Therapy: WFL for tasks assessed/performed Overall Cognitive Status: Within Functional Limits for tasks assessed  Exercises Total Joint Exercises Ankle Circles/Pumps: AROM, Both, 5 reps Heel Slides:  (too painful, encouraged pt to practice with gait belt/leg lifter at home)    General Comments        Pertinent Vitals/Pain Pain Assessment Pain Assessment:  0-10 Pain Score: 6  Pain Location: R hip Pain Descriptors / Indicators: Burning, Guarding, Grimacing, Sore Pain Intervention(s): Limited activity within patient's tolerance, Monitored during session, Ice applied    Home Living                          Prior Function            PT Goals (current goals can now be found in the care plan section) Acute Rehab PT Goals Patient Stated Goal: Less pain; regain IND PT Goal Formulation: With patient Time For Goal Achievement: 02/16/22 Potential to Achieve Goals: Good Progress towards PT goals: Progressing toward goals    Frequency    7X/week      PT Plan Current plan remains appropriate    Co-evaluation              AM-PAC PT "6 Clicks" Mobility   Outcome Measure  Help needed turning from your back to your side while in a flat bed without using bedrails?: A Little Help needed moving from lying on your back to sitting on the side of a flat bed without using bedrails?: A Little Help needed moving to and from a bed to a chair (including a wheelchair)?: A Little Help needed standing up from a chair using your arms (e.g., wheelchair or bedside chair)?: A Little Help needed to walk in hospital room?: A Little Help needed climbing 3-5 steps with a railing? : A Lot 6 Click Score: 17    End of Session Equipment Utilized During Treatment: Gait belt Activity Tolerance: Patient tolerated treatment well Patient left: with call bell/phone within reach;Other (comment) (pt wanted to remain on [facility], NT made aware also\) Nurse Communication: Mobility status PT Visit Diagnosis: Difficulty in walking, not elsewhere classified (R26.2);Pain Pain - Right/Left: Right Pain - part of body: Hip     Time: 3614-4315 PT Time Calculation (min) (ACUTE ONLY): 27 min  Charges:  $Gait Training: 23-37 mins                     Derrien Anschutz, PT  Acute Rehab Dept [facility]) [phone]  [facility] Weekend Pager [facility] only)   [phone]  02/07/2022    [facility] 02/07/2022, 5:24 PM

## 2022-02-07 NOTE — Progress Notes (Signed)
Physical Therapy Treatment Patient Details Name: Heidi [name] MRN: [id number] DOB: [birth year] Today's Date: 02/07/2022   History of Present Illness 66 y.o. 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 Failed conservative management. PMHx significant for arthritis and asthma.    PT Comments    Pt agreeable to OOB, requesting BSC. Pt left on [facility] per her request and NT made aware. Will see again and continue efforts to incr activity   Recommendations for follow up therapy are one component of a multi-disciplinary discharge planning process, led by the attending physician.  Recommendations may be updated based on patient status, additional functional criteria and insurance authorization.  Follow Up Recommendations  Follow physician's recommendations for discharge plan and follow up therapies     Assistance Recommended at Discharge Frequent or constant Supervision/Assistance  Patient can return home with the following Assistance with cooking/housework;Assist for transportation;Help with stairs or ramp for entrance;A little help with walking and/or transfers;A little help with bathing/dressing/bathroom   Equipment Recommendations  Rolling walker (2 wheels)    Recommendations for Other Services       Precautions / Restrictions Precautions Precautions: Fall Restrictions Weight Bearing Restrictions: No RLE Weight Bearing: Weight bearing as tolerated Other Position/Activity Restrictions: WBAT     Mobility  Bed Mobility Overal bed mobility: Needs Assistance Bed Mobility: Supine to Sit     Supine to sit: Min guard, HOB elevated     General bed mobility comments: Increased time with cues for sequence, very light min assist to manage RLE on to bed    Transfers Overall transfer level: Needs assistance Equipment used: Rolling walker (2 wheels) Transfers: Sit to/from Stand, Bed to chair/wheelchair/BSC Sit to Stand: Min guard,  Supervision   Step pivot transfers: Min guard, Supervision       General transfer comment: cues for LE management and use of UEs to self assist and control descent; min/guard to supervision for safety    Ambulation/Gait               General Gait Details:  (pivotal steps only to [facility])   Stairs             Wheelchair Mobility    Modified Rankin (Stroke Patients Only)       Balance Overall balance assessment: Needs assistance Sitting-balance support: No upper extremity supported, Feet supported Sitting balance-Leahy Scale: Good     Standing balance support: Bilateral upper extremity supported Standing balance-Leahy Scale: Poor                              Cognition Arousal/Alertness: Awake/alert Behavior During Therapy: WFL for tasks assessed/performed Overall Cognitive Status: Within Functional Limits for tasks assessed                                          Exercises      General Comments        Pertinent Vitals/Pain Pain Assessment Pain Assessment: 0-10 Pain Score: 6  Pain Location: R hip Pain Descriptors / Indicators: Burning, Guarding, Grimacing, Sore Pain Intervention(s): Limited activity within patient's tolerance, Monitored during session, Premedicated before session, Repositioned    Home Living                          Prior Function  PT Goals (current goals can now be found in the care plan section) Acute Rehab PT Goals Patient Stated Goal: Less pain; regain IND PT Goal Formulation: With patient Time For Goal Achievement: 02/16/22 Potential to Achieve Goals: Good Progress towards PT goals: Progressing toward goals    Frequency    7X/week      PT Plan Current plan remains appropriate    Co-evaluation              AM-PAC PT "6 Clicks" Mobility   Outcome Measure  Help needed turning from your back to your side while in a flat bed without using bedrails?: A  Little Help needed moving from lying on your back to sitting on the side of a flat bed without using bedrails?: A Little Help needed moving to and from a bed to a chair (including a wheelchair)?: A Little Help needed standing up from a chair using your arms (e.g., wheelchair or bedside chair)?: A Little Help needed to walk in hospital room?: A Little Help needed climbing 3-5 steps with a railing? : A Lot 6 Click Score: 17    End of Session Equipment Utilized During Treatment: Gait belt Activity Tolerance: Patient limited by pain;Patient limited by fatigue Patient left: with call bell/phone within reach;Other (comment) (pt wanted to remain on [facility], NT made aware also\) Nurse Communication: Mobility status PT Visit Diagnosis: Difficulty in walking, not elsewhere classified (R26.2);Pain Pain - Right/Left: Right Pain - part of body: Hip     Time: 6045-4098 PT Time Calculation (min) (ACUTE ONLY): 8 min  Charges:  $Therapeutic Activity: 8-22 mins                     Delice Bison, PT  Acute Rehab Dept [facility]) [phone]  [facility] Weekend Pager [facility] only)  [phone]  02/07/2022    [facility] 02/07/2022, 12:27 PM

## 2022-02-07 NOTE — Discharge Summary (Signed)
Patient ID: Heidi [name] MRN: [id number] DOB/AGE: 1955/09/01 66 y.o.  Admit date: 02/03/2022 Discharge date: 02/07/2022  Admission Diagnoses:  Principal Problem:   Avascular necrosis of hip, right (HCC) Active Problems:   Status post total replacement of right hip   Discharge Diagnoses:  Same  Past Medical History:  Diagnosis Date   Arthritis    Asthma     Surgeries: Procedure(s): RIGHT TOTAL HIP ARTHROPLASTY ANTERIOR APPROACH on 02/03/2022   Consultants:   Discharged Condition: Improved  Hospital Course: Heidi [name] is an 66 y.o. female who was admitted 02/03/2022 for operative treatment ofAvascular necrosis of hip, right (HCC). Patient has severe unremitting pain that affects sleep, daily activities, and work/hobbies. After pre-op clearance the patient was taken to the operating room on 02/03/2022 and underwent  Procedure(s): RIGHT TOTAL HIP ARTHROPLASTY ANTERIOR APPROACH.    Patient was given perioperative antibiotics:  Anti-infectives (From admission, onward)    Start     Dose/Rate Route Frequency Ordered Stop   02/03/22 1800  ceFAZolin (ANCEF) IVPB 1 g/50 mL premix        1 g 100 mL/hr over 30 Minutes Intravenous Every 6 hours 02/03/22 1619 02/03/22 2349   02/03/22 1100  ceFAZolin (ANCEF) IVPB 2g/100 mL premix        2 g 200 mL/hr over 30 Minutes Intravenous On call to O.R. 02/03/22 1047 02/03/22 1250        Patient was given sequential compression devices, early ambulation, and chemoprophylaxis to prevent DVT.  Patient benefited maximally from hospital stay and there were no complications.    Recent vital signs: Patient Vitals for the past 24 hrs:  BP Temp Temp src Pulse Resp SpO2  02/07/22 0532 (!) 151/91 98.7 F (37.1 C) Oral 99 18 97 %  02/06/22 2125 (!) 140/87 99.3 F (37.4 C) Oral 96 18 96 %  02/06/22 1756 131/68 98.9 F (37.2 C) Oral (!) 106 16 97 %  02/06/22 0905 138/84 98.8 F (37.1 C) Oral 98 17 97 %     Recent laboratory studies: No results for  input(s): "WBC", "HGB", "HCT", "PLT", "NA", "K", "CL", "CO2", "BUN", "CREATININE", "GLUCOSE", "INR", "CALCIUM" in the last 72 hours.  Invalid input(s): "PT", "2"   Discharge Medications:   Allergies as of 02/07/2022       Reactions   Codeine    "Makes me really sick."    Erythromycin    "Makes me really sick."         Medication List     TAKE these medications    aspirin 81 MG chewable tablet Chew 1 tablet (81 mg total) by mouth 2 (two) times daily.   gabapentin 100 MG capsule Commonly known as: NEURONTIN Take 1 capsule (100 mg total) by mouth 3 (three) times daily.   HYDROmorphone 4 MG tablet Commonly known as: Dilaudid Take 1 tablet (4 mg total) by mouth every 4 (four) hours as needed for severe pain. No more than 6 tablets daily.   loperamide 2 MG tablet Commonly known as: IMODIUM A-D Take 2 mg by mouth 4 (four) times daily as needed for diarrhea or loose stools.   loratadine 10 MG tablet Commonly known as: CLARITIN Take 10 mg by mouth daily.   naproxen sodium 220 MG tablet Commonly known as: ALEVE Take 220 mg by mouth daily as needed (pain).   omeprazole 20 MG capsule Commonly known as: PRILOSEC Take 20 mg by mouth as needed.   OVER THE COUNTER MEDICATION Take 3-4 capsules by mouth  at bedtime. CBD 100 mg   tiZANidine 4 MG tablet Commonly known as: Zanaflex Take 1 tablet (4 mg total) by mouth every 6 (six) hours as needed for muscle spasms.               Durable Medical Equipment  (From admission, onward)           Start     Ordered   02/03/22 1620  DME 3 n 1  Once        02/03/22 1619   02/03/22 1620  DME Walker rolling  Once       Question Answer Comment  Walker: With 5 Inch Wheels   Patient needs a walker to treat with the following condition Status post total replacement of right hip      02/03/22 1619            Diagnostic Studies: DG Pelvis Portable  Result Date: 02/03/2022 CLINICAL DATA:  Status post total right hip  arthroplasty. EXAM: PORTABLE PELVIS 1-2 VIEWS COMPARISON:  Pelvis and right hip radiographs 12/15/2021 FINDINGS: Interval total right hip arthroplasty. No perihardware lucency is seen to indicate hardware failure or loosening. Expected postoperative changes including lateral hip soft tissue swelling and subcutaneous air. There are lateral surgical skin staples. There is again moderate superior left femoral head collapse with superior left femoral head sclerosis and cystic changes. Moderate superolateral left femoral head-neck junction and superolateral left acetabular degenerative osteophytes. IMPRESSION: 1. Interval total right hip arthroplasty without evidence of hardware failure. 2. Moderate superior left femoral head collapse suggesting chronic avascular necrosis. This is similar to prior. Electronically Signed   By: Neita Garnet M.D.   On: 02/03/2022 15:11   DG HIP UNILAT WITH PELVIS 1V RIGHT  Result Date: 02/03/2022 CLINICAL DATA:  Intraoperative fluoroscopy for total right hip arthroplasty from anterior approach. EXAM: DG HIP (WITH OR WITHOUT PELVIS) 1V RIGHT COMPARISON:  Right hip radiographs 12/15/2021 FINDINGS: Images were performed intraoperatively without the presence of a radiologist. Interval total right hip arthroplasty. No evidence of hardware complication. Total fluoroscopy images: 3 Total fluoroscopy time: 12 seconds Total dose: Radiation Exposure Index (as provided by the fluoroscopic device): 1.383 mGy air Kerma Please see intraoperative findings for further detail. IMPRESSION: Intraoperative fluoroscopy for total right hip arthroplasty. Electronically Signed   By: Neita Garnet M.D.   On: 02/03/2022 13:57   DG C-Arm 1-60 Min-No Report  Result Date: 02/03/2022 Fluoroscopy was utilized by the requesting physician.  No radiographic interpretation.    Disposition: Discharge disposition: 01-Home or Self Care          Follow-up Information     Kathryne Hitch, MD Follow  up in 2 week(s).   Specialty: Orthopedic Surgery Contact information: [address] [place] [address] [phone]         Jobe Gibbon, [facility] Follow up.   Specialty: Home Health Services Why: Rolene Arbour will contact you to set up the first visit. Contact information: [address] [place] [address] [phone]                  Signed: Kathryne Hitch 02/07/2022, 6:58 AM

## 2022-02-07 NOTE — Progress Notes (Signed)
02/07/22 1228  PT Visit Information  Last PT Received On 02/07/22  Assistance Needed  Pt is progressing, able to amb 45' with RW and min/guard to supervision for safety. Will see again later today. Pt is hopeful to d/c   History of Present Illness 66 y.o. 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 Failed conservative management. PMHx significant for arthritis and asthma.  Subjective Data  Patient Stated Goal Less pain; regain IND  Precautions  Precautions Fall  Restrictions  Weight Bearing Restrictions No  Other Position/Activity Restrictions WBAT  Pain Assessment  Pain Assessment 0-10  Pain Score 6  Pain Location R hip  Pain Descriptors / Indicators Burning;Guarding;Grimacing;Sore  Pain Intervention(s) Limited activity within patient's tolerance;Monitored during session;Premedicated before session;Repositioned;Ice applied  Cognition  Arousal/Alertness Awake/alert  Behavior During Therapy WFL for tasks assessed/performed  Overall Cognitive Status Within Functional Limits for tasks assessed  Bed Mobility  Overal bed mobility Needs Assistance  Bed Mobility Sit to Supine  Sit to supine Min assist  General bed mobility comments Increased time with cues for sequence, very light min assist to manage RLE on to bed  Transfers  Overall transfer level Needs assistance  Equipment used Rolling walker (2 wheels)  Transfers Sit to/from Stand  Sit to Stand Min guard;Supervision  General transfer comment cues for LE management and use of UEs to self assist and control descent; min/guard to supervision for safety  Ambulation/Gait  Ambulation/Gait assistance Min assist;Min guard  Gait Distance (Feet) 45 Feet  Assistive device Rolling walker (2 wheels)  Gait Pattern/deviations Step-to pattern;Decreased stance time - right  General Gait Details cues for initial sequence, min/guard for safety  Balance  Overall balance assessment Needs  assistance  Sitting-balance support No upper extremity supported;Feet supported  Sitting balance-Leahy Scale Good  Standing balance support Bilateral upper extremity supported  Standing balance-Leahy Scale Poor  Total Joint Exercises  Ankle Circles/Pumps AROM;Both;5 reps  PT - End of Session  Equipment Utilized During Treatment Gait belt  Activity Tolerance Patient tolerated treatment well  Patient left with call bell/phone within reach;Other (comment) (pt wanted to remain on [facility], NT made aware also\)  Nurse Communication Mobility status   PT - Assessment/Plan  PT Plan Current plan remains appropriate  PT Visit Diagnosis Difficulty in walking, not elsewhere classified (R26.2);Pain  Pain - Right/Left Right  Pain - part of body Hip  PT Frequency (ACUTE ONLY) 7X/week  Follow Up Recommendations Follow physician's recommendations for discharge plan and follow up therapies  Assistance recommended at discharge Frequent or constant Supervision/Assistance  Patient can return home with the following Assistance with cooking/housework;Assist for transportation;Help with stairs or ramp for entrance;A little help with walking and/or transfers;A little help with bathing/dressing/bathroom  PT equipment Rolling walker (2 wheels)  AM-PAC PT "6 Clicks" Mobility Outcome Measure (Version 2)  Help needed turning from your back to your side while in a flat bed without using bedrails? 3  Help needed moving from lying on your back to sitting on the side of a flat bed without using bedrails? 3  Help needed moving to and from a bed to a chair (including a wheelchair)? 3  Help needed standing up from a chair using your arms (e.g., wheelchair or bedside chair)? 3  Help needed to walk in hospital room? 3  Help needed climbing 3-5 steps with a railing?  2  6 Click Score 17  Consider Recommendation of Discharge To: Home with [facility]  Progressive  Mobility  What is the highest level of mobility based on the progressive  mobility assessment? Level 5 (Walks with assist in room/hall) - Balance while stepping forward/back and can walk in room with assist - Complete  Activity Ambulated with assistance in hallway  PT Goal Progression  Progress towards PT goals Progressing toward goals  Acute Rehab PT Goals  PT Goal Formulation With patient  Time For Goal Achievement 02/16/22  Potential to Achieve Goals Good  PT Time Calculation  PT Start Time (ACUTE ONLY) 1202  PT Stop Time (ACUTE ONLY) 1220  PT Time Calculation (min) (ACUTE ONLY) 18 min  PT General Charges  $$ ACUTE PT VISIT 1 Visit  PT Treatments  $Gait Training 8-22 mins

## 2022-02-10 ENCOUNTER — Other Ambulatory Visit: Payer: Self-pay

## 2022-02-10 ENCOUNTER — Telehealth: Payer: Self-pay

## 2022-02-10 NOTE — Telephone Encounter (Signed)
Please see message below and let me know if there is anything that I can help you with

## 2022-02-10 NOTE — Telephone Encounter (Signed)
Pt is s/p a total hip replacement on 02/08/22 with CB  and Care coordinator states that the pt is having bloody drain through dressing. She is not able to come in the office as she has a disabled child at home and can not leave the child or bring him. She states that home health was supposed to come out ([facility]) and they have now advised that they can not accept the referral. [facility] said that they could pick up referral but she has been trying to reach them all day and can not get anyone to answer. Gave me the number to a contact Amy Al Corpus [phone] I have called and left a message for her to cal me back and see if we can arrange for [facility] to come out and assess. I am sending this message to you to see if you would like to run this by CB and see if he wants pt to just remove surgical dressing and reapply dry dressing or whatever other options she may have. I will continue to work on things from the [facility] aspect but just wanted to let you know and see if you could reach out to the pt as well.The case managers name is Kathie Rhodes and her number is [phone] she would like to be in the loop at well.

## 2022-02-10 NOTE — Telephone Encounter (Signed)
Pt called and states that he leg is 3 times the size of her other leg and wants to know if this is normal. I asked the pt if she had compression sock that she was wearing and she states that she did in the hospital but she does not have them on know that she does not like them. I advised for her to utilize what she had been given and that this weill help and p states that she did not bring them home. Pt states that she will just apply ice and keep moving her leg.

## 2022-02-10 NOTE — Telephone Encounter (Signed)
Shayne Alken case manager called and states that the pt is eligible for custodial care while in post op and requested that an order be faxed to a company called [employer] att: Smitty Cords to fax #[phone] with Lorna Dibble this was written and faxed code used [phone] will call with any other questions. This is a right total hip that is at home with an adult disabled son and needing assistance.

## 2022-02-10 NOTE — Telephone Encounter (Signed)
[facility] called and states that they do not have enough staff to be able to provide this pt with nursing care but they can follow through with the orders for PT/OT.   CB [phone]

## 2022-02-13 ENCOUNTER — Telehealth: Payer: Self-pay

## 2022-02-13 ENCOUNTER — Other Ambulatory Visit: Payer: Self-pay | Admitting: Physician Assistant

## 2022-02-13 MED ORDER — HYDROMORPHONE HCL 4 MG PO TABS
4.0000 mg | ORAL_TABLET | Freq: Four times a day (QID) | ORAL | 0 refills | Status: DC | PRN
Start: 2022-02-13 — End: 2022-02-20

## 2022-02-13 MED ORDER — TIZANIDINE HCL 4 MG PO TABS
4.0000 mg | ORAL_TABLET | Freq: Four times a day (QID) | ORAL | 0 refills | Status: DC | PRN
Start: 2022-02-13 — End: 2022-02-20

## 2022-02-13 NOTE — Telephone Encounter (Signed)
Patient would like a Rx refill on Tizanidine and Hydromorphone sent to her pharmacy.  Cb# [phone].  Please advise.  Thank you.

## 2022-02-15 ENCOUNTER — Telehealth: Payer: Self-pay | Admitting: Orthopaedic Surgery

## 2022-02-15 NOTE — Telephone Encounter (Signed)
Patient physical therapy nurse called in stating they did a evaluation today and she has not had a bowel movement in 1 full day , PT request 2 times a week for 1, and 1 time a week for 1 week. Nurse would also like to know what way can we get her to use the bathroom she is miserable and stomach is hurting

## 2022-02-16 ENCOUNTER — Telehealth: Payer: Self-pay | Admitting: *Deleted

## 2022-02-16 ENCOUNTER — Other Ambulatory Visit: Payer: Self-pay

## 2022-02-16 ENCOUNTER — Telehealth: Payer: Self-pay

## 2022-02-16 ENCOUNTER — Encounter: Payer: PPO | Admitting: Physician Assistant

## 2022-02-16 MED ORDER — ONDANSETRON 4 MG PO TBDP
4.0000 mg | ORAL_TABLET | Freq: Four times a day (QID) | ORAL | 0 refills | Status: DC | PRN
Start: 2022-02-16 — End: 2022-02-20

## 2022-02-16 NOTE — Telephone Encounter (Signed)
Called pt and helped her find two locations that had some

## 2022-02-16 NOTE — Telephone Encounter (Signed)
Let the PT know we have already talked to patient telling her she could take MagCitrate for her constipation

## 2022-02-16 NOTE — Telephone Encounter (Signed)
Lvm for pt to cb to discuss  

## 2022-02-16 NOTE — Telephone Encounter (Signed)
Pt states she has called all the pharmacies around her about getting the magcitrate and no one has it- says they are are all out of stock- wants to know if there is something else she can take. Please advise

## 2022-02-16 NOTE — Telephone Encounter (Signed)
Patient was returning call to Heidi [name]. Please call back to advise.  Thank you.

## 2022-02-16 NOTE — Telephone Encounter (Signed)
Patient aware to get MagCitrate; drink half bottle today and if no better tomorrow drink the other half of the bottle  Also call her in some Zofran and rescheduled her appt to next week

## 2022-02-16 NOTE — Telephone Encounter (Signed)
Patient home health nurse Vista Mink wanted Autumn to call her phone at [phone] to discuss patient, informed her we did call but not the number she wanted [place] to call

## 2022-02-20 ENCOUNTER — Encounter: Payer: Self-pay | Admitting: Physician Assistant

## 2022-02-20 ENCOUNTER — Ambulatory Visit (INDEPENDENT_AMBULATORY_CARE_PROVIDER_SITE_OTHER): Payer: PPO | Admitting: Physician Assistant

## 2022-02-20 DIAGNOSIS — Z96641 Presence of right artificial hip joint: Secondary | ICD-10-CM

## 2022-02-20 MED ORDER — TIZANIDINE HCL 4 MG PO TABS
4.0000 mg | ORAL_TABLET | Freq: Four times a day (QID) | ORAL | 0 refills | Status: DC | PRN
Start: 2022-02-20 — End: 2022-09-08

## 2022-02-20 MED ORDER — GABAPENTIN 100 MG PO CAPS: 100.0000 mg | ORAL_CAPSULE | Freq: Three times a day (TID) | ORAL | 1 refills | Status: DC

## 2022-02-20 MED ORDER — ONDANSETRON 4 MG PO TBDP: 4.0000 mg | ORAL_TABLET | Freq: Four times a day (QID) | ORAL | 0 refills | Status: DC | PRN

## 2022-02-20 MED ORDER — HYDROMORPHONE HCL 4 MG PO TABS
4.0000 mg | ORAL_TABLET | Freq: Four times a day (QID) | ORAL | 0 refills | Status: DC | PRN
Start: 2022-02-20 — End: 2023-01-04

## 2022-02-20 NOTE — Progress Notes (Signed)
HPI: Ms. Schnurr returns today status post right total hip arthroplasty 02/03/2022.  She states overall she is doing well.  She does feel a lot of stiffness in the right hip.  States her pain is 7 out of 10 pain at worst.  She had no fevers or chills.  She is on aspirin twice a day for DVT prophylaxis no aspirin prior to surgery.  Review of systems: See HPI otherwise negative  Physical exam: General well-developed well-nourished female in no acute distress walks with an antalgic gait with the use of a rollator.  Right hip: Surgical incisions healing well no signs of infection.  There is slight erythema from staple irritation.  No drainage.  No wound dehiscence.  Right calf supple nontender.  Dorsiflexion plantarflexion right ankle intact.  Internal and external rotation reveals fluid range of motion right hip minimal discomfort.  Impression: Status post right total hip arthroplasty 02/03/2022  Plan: Back in 2 weeks for wound check.  At that time most likely we will set her up for left total hip arthroplasty due to the fact that she has left femoral head collapse.  Questions were encouraged and answered.

## 2022-02-21 ENCOUNTER — Telehealth: Payer: Self-pay | Admitting: Orthopaedic Surgery

## 2022-02-21 NOTE — Telephone Encounter (Signed)
Patient called asked when can she get in the hot tub? The number to contact patient is [phone]

## 2022-02-21 NOTE — Telephone Encounter (Signed)
Called and advised pt. She stated understanding  

## 2022-02-22 ENCOUNTER — Inpatient Hospital Stay: Payer: PPO | Admitting: Orthopaedic Surgery

## 2022-03-01 ENCOUNTER — Telehealth: Payer: Self-pay | Admitting: Orthopaedic Surgery

## 2022-03-01 NOTE — Telephone Encounter (Signed)
Sherice with  [facility]

## 2022-03-01 NOTE — Telephone Encounter (Signed)
Heidi [name] called and states that she is being discharged today. 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 She also does not have a primary care doctor so [facility] is her only doctor. Her hip that needs surgery is about a 6/10 and the replacement is a 4/10 pain rate. She has not being taking all her medications like she suppose to taking it. She has pretty much stopped all medications.   CB [phone] [phone]

## 2022-03-02 NOTE — Telephone Encounter (Signed)
noted 

## 2022-03-06 ENCOUNTER — Ambulatory Visit (INDEPENDENT_AMBULATORY_CARE_PROVIDER_SITE_OTHER): Payer: PPO | Admitting: Physician Assistant

## 2022-03-06 ENCOUNTER — Encounter: Payer: Self-pay | Admitting: Physician Assistant

## 2022-03-06 VITALS — BP 125/82 | HR 121 | Temp 98.8°F | Ht 64.0 in | Wt 138.8 lb

## 2022-03-06 DIAGNOSIS — Z96641 Presence of right artificial hip joint: Secondary | ICD-10-CM

## 2022-03-06 MED ORDER — TEMAZEPAM 7.5 MG PO CAPS: 7.5000 mg | ORAL_CAPSULE | Freq: Every evening | ORAL | 0 refills | Status: DC | PRN

## 2022-03-06 NOTE — Progress Notes (Signed)
HPI: Heidi [name] returns today status post right total hip arthroplasty 02/03/2022.  She states she is not sleeping well.  Notes that she has had insomnia for years.  She states she had some withdrawal symptoms coming off the pain medicine states she is on no medications at this point time for pain.  Notes that her right hip is doing well.  She still having pain in her left hip which she has known avascular necrosis up.  However she is not wanting to schedule surgery at this time due to the fact she needs to talk with her daughter prior to scheduling surgery.  Physical exam: General well-developed well-nourished female no acute distress.  Mood and affect appropriate. Right hip surgical incisions healing well no signs of infection or wound dehiscence.  Calf supple nontender.  Dorsiflexion plantarflexion right ankle intact.  Internal/external rotation of the right hip causes no significant pain.  Impression: Status post right total hip arthroplasty Insomnia  Plan: Place her on Restoril.  Discussed with her good sleep hygiene.  She also needs to find a primary care physician.  Phone numbers are given where she can contact the [facility] system for finding a new primary care physician.  Regards to her right hip we did not need to see her back for at least 6 months.  Sooner if there is any questions concerns.  She will let us know how she would like to proceed with a left hip.  Questions were encouraged and answered

## 2022-07-31 ENCOUNTER — Telehealth: Payer: Self-pay | Admitting: Orthopaedic Surgery

## 2022-07-31 NOTE — Telephone Encounter (Signed)
Patient states she needs a new handicap renewal sticker and she is unable to come and fill out paper word. Please call [phone]

## 2022-08-22 ENCOUNTER — Telehealth: Payer: Self-pay

## 2022-08-22 NOTE — Telephone Encounter (Signed)
Patient called about scheduling other hip.  Okay to reschedule?  Does she need office visit?

## 2022-08-25 NOTE — Telephone Encounter (Signed)
I called patient and left voice mail for return call. °

## 2022-08-30 NOTE — Telephone Encounter (Signed)
I called patient and scheduled surgery. 

## 2022-09-08 ENCOUNTER — Other Ambulatory Visit: Payer: Self-pay | Admitting: Physician Assistant

## 2022-09-08 ENCOUNTER — Telehealth: Payer: Self-pay | Admitting: Orthopaedic Surgery

## 2022-09-08 ENCOUNTER — Other Ambulatory Visit: Payer: Self-pay | Admitting: Orthopaedic Surgery

## 2022-09-08 MED ORDER — TIZANIDINE HCL 4 MG PO TABS: 4.0000 mg | ORAL_TABLET | Freq: Four times a day (QID) | ORAL | 0 refills | Status: DC | PRN

## 2022-09-08 NOTE — Telephone Encounter (Signed)
Patient requesting muscle relaxer refill

## 2022-10-02 ENCOUNTER — Other Ambulatory Visit: Payer: Self-pay | Admitting: Orthopaedic Surgery

## 2022-10-17 ENCOUNTER — Telehealth: Payer: Self-pay

## 2022-10-17 ENCOUNTER — Other Ambulatory Visit: Payer: Self-pay | Admitting: Orthopaedic Surgery

## 2022-10-17 NOTE — Telephone Encounter (Signed)
Spoke with patient and confirmed left THA for 01/19/23.  Per Dr. Magnus Ivan, patient does not need office visit unless required by insurance.  Patient wants to know if Dr. Magnus Ivan will prescribe Lunesta 3 mg.  She is having trouble sleeping.  She does not have a PCP.  She stated she would come for office visit if needed.  Also, patient will need a renewed handicapped placard as her will expire in July.  Last time it was mailed to her.

## 2022-10-18 NOTE — Telephone Encounter (Signed)
Patient aware I will mail handicap

## 2022-11-06 ENCOUNTER — Other Ambulatory Visit: Payer: Self-pay | Admitting: Orthopaedic Surgery

## 2022-12-11 ENCOUNTER — Encounter: Payer: Self-pay | Admitting: Physician Assistant

## 2022-12-11 ENCOUNTER — Other Ambulatory Visit (INDEPENDENT_AMBULATORY_CARE_PROVIDER_SITE_OTHER): Payer: PPO

## 2022-12-11 ENCOUNTER — Ambulatory Visit (INDEPENDENT_AMBULATORY_CARE_PROVIDER_SITE_OTHER): Payer: PPO | Admitting: Physician Assistant

## 2022-12-11 VITALS — Ht 63.5 in | Wt 135.0 lb

## 2022-12-11 DIAGNOSIS — G8929 Other chronic pain: Secondary | ICD-10-CM

## 2022-12-11 DIAGNOSIS — M25562 Pain in left knee: Secondary | ICD-10-CM

## 2022-12-11 MED ORDER — MELOXICAM 15 MG PO TABS: 15.0000 mg | ORAL_TABLET | Freq: Every day | ORAL | 0 refills | Status: AC
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
## 2022-12-13 ENCOUNTER — Other Ambulatory Visit: Payer: Self-pay | Admitting: Orthopaedic Surgery

## 2023-01-05 ENCOUNTER — Other Ambulatory Visit: Payer: Self-pay | Admitting: Orthopaedic Surgery

## 2023-01-08 NOTE — Progress Notes (Signed)
COVID Vaccine received:  [x]  No []  Yes Date of any COVID positive Test in last 90 days: No PCP - Dr. Doneen Poisson Cardiologist - No  Chest x-ray -  EKG -  01/09/23  Stress Test - No ECHO -  Cardiac Cath - NO  Bowel Prep - [x]  No  []   Yes ______  Pacemaker / ICD device [x]  No []  Yes   Spinal Cord Stimulator:[x]  No []  Yes       History of Sleep Apnea? [x]  No []  Yes   CPAP used?- [x]  No []  Yes    Does the patient monitor blood sugar?          [x]  No []  Yes  []  N/A  Patient has: [x]  NO Hx DM   []  Pre-DM                 []  DM1  []   DM2 Does patient have a [employer] or Dexacom? [x]  No []  Yes   Fasting Blood Sugar Ranges-  Checks Blood Sugar __0___ times a day  GLP1 agonist / usual dose - No GLP1 instructions:  SGLT-2 inhibitors / usual dose - No SGLT-2 instructions:   Blood Thinner / Instructions:No Aspirin Instructions:No  Comments:   Activity level: Patient is able  to climb a flight of stairs without difficulty; [x]  No CP  []  No SOB,  Patient can perform ADLs without assistance.   Anesthesia review:   Patient denies shortness of breath, fever, cough and chest pain at PAT appointment.  Patient verbalized understanding and agreement to the Pre-Surgical Instructions that were given to them at this PAT appointment. Patient was also educated of the need to review these PAT instructions again prior to his/her surgery.I reviewed the appropriate phone numbers to call if they have any and questions or concerns.

## 2023-01-08 NOTE — Patient Instructions (Signed)
SURGICAL WAITING ROOM VISITATION  Patients having surgery or a procedure may have no more than 2 support people in the waiting area - these visitors may rotate.    Children under the age of 59 must have an adult with them who is not the patient.  Due to an increase in RSV and influenza rates and associated hospitalizations, children ages 72 and under may not visit patients in [facility] hospitals.  If the patient needs to stay at the hospital during part of their recovery, the visitor guidelines for inpatient rooms apply. Pre-op nurse will coordinate an appropriate time for 1 support person to accompany patient in pre-op.  This support person may not rotate.    Please refer to the [facility] website for the visitor guidelines for Inpatients (after your surgery is over and you are in a regular room).       Your procedure is scheduled on: 01/19/23   Report to [facility] Main Entrance    Report to admitting at 5:15 AM   Call this number if you have problems the morning of surgery [phone]   Do not eat food :After Midnight.   After Midnight you may have the following liquids until 4:15 AM DAY OF SURGERY  Water Non-Citrus Juices (without pulp, NO RED-Apple, White grape, White cranberry) Black Coffee (NO MILK/CREAM OR CREAMERS, sugar ok)  Clear Tea (NO MILK/CREAM OR CREAMERS, sugar ok) regular and decaf                             Plain Jell-O (NO RED)                                           Fruit ices (not with fruit pulp, NO RED)                                     Popsicles (NO RED)                                                               Sports drinks like Gatorade (NO RED)                  The day of surgery:  Drink ONE (1) Pre-Surgery Clear Ensure  at 4:15 AM the morning of surgery. Drink in one sitting. Do not sip.  This drink was given to you during your hospital  pre-op appointment visit. Nothing else to drink after completing the  Pre-Surgery Clear  Ensure    Oral Hygiene is also important to reduce your risk of infection.                                    Remember - BRUSH YOUR TEETH THE MORNING OF SURGERY WITH YOUR REGULAR TOOTHPASTE  DENTURES WILL BE REMOVED PRIOR TO SURGERY PLEASE DO NOT APPLY "Poly grip" OR ADHESIVES!!!   Take these medicines the morning of surgery with A SIP OF WATER: Loratadine(Claritin), Prilosec(Omeprazole)  You may not have any metal on your body including hair pins, jewelry, and body piercing             Do not wear make-up, lotions, powders, perfumes, or deodorant  Do not wear nail polish including gel and S&S, artificial/acrylic nails, or any other type of covering on natural nails including finger and toenails. If you have artificial nails, gel coating, etc. that needs to be removed by a nail salon please have this removed prior to surgery or surgery may need to be canceled/ delayed if the surgeon/ anesthesia feels like they are unable to be safely monitored.   Do not shave  48 hours prior to surgery.    Do not bring valuables to the hospital. [name] IS NOT             RESPONSIBLE   FOR VALUABLES.   Contacts, glasses, dentures or bridgework may not be worn into surgery.   Bring small overnight bag day of surgery.   DO NOT BRING YOUR HOME MEDICATIONS TO THE HOSPITAL. PHARMACY WILL DISPENSE MEDICATIONS LISTED ON YOUR MEDICATION LIST TO YOU DURING YOUR ADMISSION IN THE HOSPITAL!    Patients discharged on the day of surgery will not be allowed to drive home.  Someone NEEDS to stay with you for the first 24 hours after anesthesia.   Special Instructions: Bring a copy of your healthcare power of attorney and living will documents the day of surgery if you haven't scanned them before.              Please read over the following fact sheets you were given: IF YOU HAVE QUESTIONS ABOUT YOUR PRE-OP INSTRUCTIONS PLEASE CALL [name]   If you received a COVID test during your  pre-op visit  it is requested that you wear a mask when out in public, stay away from anyone that may not be feeling well and notify your surgeon if you develop symptoms. If you test positive for Covid or have been in contact with anyone that has tested positive in the last 10 days please notify you surgeon.      Pre-operative 5 CHG [name] Instructions   You can play a key role in reducing the risk of infection after surgery. Your skin needs to be as free of germs as possible. You can reduce the number of germs on your skin by washing with CHG (chlorhexidine gluconate) soap before surgery. CHG is an antiseptic soap that kills germs and continues to kill germs even after washing.   DO NOT use if you have an allergy to chlorhexidine/CHG or antibacterial soaps. If your skin becomes reddened or irritated, stop using the CHG and notify one of our RNs at [phone].   Please shower with the CHG soap starting 4 days before surgery using the following schedule:     Please keep in mind the following:  DO NOT shave, including legs and underarms, starting the day of your first shower.   You may shave your face at any point before/day of surgery.  Place clean sheets on your bed the day you start using CHG soap. Use a clean washcloth (not used since being washed) for each shower. DO NOT sleep with pets once you start using the CHG.   CHG Shower Instructions:  If you choose to wash your hair and private area, wash first with your normal shampoo/soap.  After you use shampoo/soap, rinse your hair and body thoroughly to remove shampoo/soap residue.  Turn the  water OFF and apply about 3 tablespoons (45 ml) of CHG soap to a CLEAN washcloth.  Apply CHG soap ONLY FROM YOUR NECK DOWN TO YOUR TOES (washing for 3-5 minutes)  DO NOT use CHG soap on face, private areas, open wounds, or sores.  Pay special attention to the area where your surgery is being performed.  If you are having back surgery, having someone  wash your back for you may be helpful. Wait 2 minutes after CHG soap is applied, then you may rinse off the CHG soap.  Pat dry with a clean towel  Put on clean clothes/pajamas   If you choose to wear lotion, please use ONLY the CHG-compatible lotions on the back of this paper.     Additional instructions for the day of surgery: DO NOT APPLY any lotions, deodorants, cologne, or perfumes.   Put on clean/comfortable clothes.  Brush your teeth.  Ask your nurse before applying any prescription medications to the skin.      CHG Compatible Lotions   Aveeno Moisturizing lotion  Cetaphil Moisturizing Cream  Cetaphil Moisturizing Lotion  Clairol Herbal Essence Moisturizing Lotion, Dry Skin  Clairol Herbal Essence Moisturizing Lotion, Extra Dry Skin  Clairol Herbal Essence Moisturizing Lotion, Normal Skin  Curel Age Defying Therapeutic Moisturizing Lotion with Alpha Hydroxy  Curel Extreme Care Body Lotion  Curel Soothing Hands Moisturizing Hand Lotion  Curel Therapeutic Moisturizing Cream, Fragrance-Free  Curel Therapeutic Moisturizing Lotion, Fragrance-Free  Curel Therapeutic Moisturizing Lotion, Original Formula  Eucerin Daily Replenishing Lotion  Eucerin Dry Skin Therapy Plus Alpha Hydroxy Crme  Eucerin Dry Skin Therapy Plus Alpha Hydroxy Lotion  Eucerin Original Crme  Eucerin Original Lotion  Eucerin Plus Crme Eucerin Plus Lotion  Eucerin TriLipid Replenishing Lotion  Keri Anti-Bacterial Hand Lotion  Keri Deep Conditioning Original Lotion Dry Skin Formula Softly Scented  Keri Deep Conditioning Original Lotion, Fragrance Free Sensitive Skin Formula  Keri Lotion Fast Absorbing Fragrance Free Sensitive Skin Formula  Keri Lotion Fast Absorbing Softly Scented Dry Skin Formula  Keri Original Lotion  Keri Skin Renewal Lotion Keri Silky Smooth Lotion  Keri Silky Smooth Sensitive Skin Lotion  Nivea Body Creamy Conditioning Oil  Nivea Body Extra Enriched TEFL teacher Moisturizing Lotion Nivea Crme  Nivea Skin Firming Lotion  NutraDerm 30 Skin Lotion  NutraDerm Skin Lotion  NutraDerm Therapeutic Skin Cream  NutraDerm Therapeutic Skin Lotion  ProShield Protective Hand Cream  Provon moisturizing lotion  WHAT IS A BLOOD TRANSFUSION? Blood Transfusion Information  A transfusion is the replacement of blood or some of its parts. Blood is made up of multiple cells which provide different functions. Red blood cells carry oxygen and are used for blood loss replacement. White blood cells fight against infection. Platelets control bleeding. Plasma helps clot blood. Other blood products are available for specialized needs, such as hemophilia or other clotting disorders. BEFORE THE TRANSFUSION  Who gives blood for transfusions?  Healthy volunteers who are fully evaluated to make sure their blood is safe. This is blood bank blood. Transfusion therapy is the safest it has ever been in the practice of medicine. Before blood is taken from a donor, a complete history is taken to make sure that person has no history of diseases nor engages in risky social behavior (examples are intravenous drug use or sexual activity with multiple partners). The donor's travel history is screened to minimize risk of transmitting infections, such as malaria. The donated blood is tested for signs  of infectious diseases, such as HIV and hepatitis. The blood is then tested to be sure it is compatible with you in order to minimize the chance of a transfusion reaction. If you or a relative donates blood, this is often done in anticipation of surgery and is not appropriate for emergency situations. It takes many days to process the donated blood. RISKS AND COMPLICATIONS Although transfusion therapy is very safe and saves many lives, the main dangers of transfusion include:  Getting an infectious disease. Developing a transfusion reaction. This is an allergic reaction to  something in the blood you were given. Every precaution is taken to prevent this. The decision to have a blood transfusion has been considered carefully by your caregiver before blood is given. Blood is not given unless the benefits outweigh the risks. AFTER THE TRANSFUSION Right after receiving a blood transfusion, you will usually feel much better and more energetic. This is especially true if your red blood cells have gotten low (anemic). The transfusion raises the level of the red blood cells which carry oxygen, and this usually causes an energy increase. The nurse administering the transfusion will monitor you carefully for complications. HOME CARE INSTRUCTIONS  No special instructions are needed after a transfusion. You may find your energy is better. Speak with your caregiver about any limitations on activity for underlying diseases you may have. SEEK MEDICAL CARE IF:  Your condition is not improving after your transfusion. You develop redness or irritation at the intravenous (IV) site. SEEK IMMEDIATE MEDICAL CARE IF:  Any of the following symptoms occur over the next 12 hours: Shaking chills. You have a temperature by mouth above 102 F (38.9 C), not controlled by medicine. Chest, back, or muscle pain. People around you feel you are not acting correctly or are confused. Shortness of breath or difficulty breathing. Dizziness and fainting. You get a rash or develop hives. You have a decrease in urine output. Your urine turns a dark color or changes to pink, red, or brown. Any of the following symptoms occur over the next 10 days: You have a temperature by mouth above 102 F (38.9 C), not controlled by medicine. Shortness of breath. Weakness after normal activity. The white part of the eye turns yellow (jaundice). You have a decrease in the amount of urine or are urinating less often. Your urine turns a dark color or changes to pink, red, or brown. Document Released: 06/23/2000  Document Revised: 09/18/2011 Document Reviewed: 02/10/2008 [facility] Patient Information 2014 [place], [place].Incentive Spirometer (Watch this video at home: [email or web address])  An incentive spirometer is a tool that can help keep your lungs clear and active. This tool measures how well you are filling your lungs with each breath. Taking long deep breaths may help reverse or decrease the chance of developing breathing (pulmonary) problems (especially infection) following: A long period of time when you are unable to move or be active. BEFORE THE PROCEDURE  If the spirometer includes an indicator to show your best effort, your nurse or respiratory therapist will set it to a desired goal. If possible, sit up straight or lean slightly forward. Try not to slouch. Hold the incentive spirometer in an upright position. INSTRUCTIONS FOR USE  Sit on the edge of your bed if possible, or sit up as far as you can in bed or on a chair. Hold the incentive spirometer in an upright position. Breathe out normally. Place the mouthpiece in your mouth and seal your lips tightly around it. Breathe in  slowly and as deeply as possible, raising the piston or the ball toward the top of the column. Hold your breath for 3-5 seconds or for as long as possible. Allow the piston or ball to fall to the bottom of the column. Remove the mouthpiece from your mouth and breathe out normally. Rest for a few seconds and repeat Steps 1 through 7 at least 10 times every 1-2 hours when you are awake. Take your time and take a few normal breaths between deep breaths. The spirometer may include an indicator to show your best effort. Use the indicator as a goal to work toward during each repetition. After each set of 10 deep breaths, practice coughing to be sure your lungs are clear. If you have an incision (the cut made at the time of surgery), support your incision when coughing by placing a pillow or rolled up  towels firmly against it. Once you are able to get out of bed, walk around indoors and cough well. You may stop using the incentive spirometer when instructed by your caregiver.  RISKS AND COMPLICATIONS Take your time so you do not get dizzy or light-headed. If you are in pain, you may need to take or ask for pain medication before doing incentive spirometry. It is harder to take a deep breath if you are having pain. AFTER USE Rest and breathe slowly and easily. It can be helpful to keep track of a log of your progress. Your caregiver can provide you with a simple table to help with this. If you are using the spirometer at home, follow these instructions: SEEK MEDICAL CARE IF:  You are having difficultly using the spirometer. You have trouble using the spirometer as often as instructed. Your pain medication is not giving enough relief while using the spirometer. You develop fever of 100.5 F (38.1 C) or higher. SEEK IMMEDIATE MEDICAL CARE IF:  You cough up bloody sputum that had not been present before. You develop fever of 102 F (38.9 C) or greater. You develop worsening pain at or near the incision site. MAKE SURE YOU:  Understand these instructions. Will watch your condition. Will get help right away if you are not doing well or get worse. Document Released: 11/06/2006 Document Revised: 09/18/2011 Document Reviewed: 01/07/2007 [facility] Patient Information 2014 [place], [place].

## 2023-01-09 ENCOUNTER — Encounter (HOSPITAL_COMMUNITY)
Admission: RE | Admit: 2023-01-09 | Discharge: 2023-01-09 | Disposition: A | Discharge status: Home / Self Care | Payer: PPO | Source: Ambulatory Visit | Attending: Orthopaedic Surgery | Admitting: Orthopaedic Surgery

## 2023-01-09 ENCOUNTER — Other Ambulatory Visit: Payer: Self-pay

## 2023-01-09 ENCOUNTER — Encounter (HOSPITAL_COMMUNITY): Payer: Self-pay

## 2023-01-09 VITALS — BP 157/83 | HR 94 | Temp 98.3°F | Resp 18 | Ht 64.0 in | Wt 145.0 lb

## 2023-01-09 DIAGNOSIS — M87052 Idiopathic aseptic necrosis of left femur: Secondary | ICD-10-CM | POA: Insufficient documentation

## 2023-01-09 DIAGNOSIS — Z01818 Encounter for other preprocedural examination: Secondary | ICD-10-CM | POA: Insufficient documentation

## 2023-01-09 HISTORY — DX: Gastro-esophageal reflux disease without esophagitis: K21.9

## 2023-01-09 LAB — CBC
HCT: 44.2 % (ref 36.0–46.0)
Hemoglobin: 14.7 g/dL (ref 12.0–15.0)
MCH: 36.5 pg — ABNORMAL HIGH (ref 26.0–34.0)
MCHC: 33.3 g/dL (ref 30.0–36.0)
MCV: 109.7 fL — ABNORMAL HIGH (ref 80.0–100.0)
Platelets: 176 10*3/uL (ref 150–400)
RBC: 4.03 MIL/uL (ref 3.87–5.11)
RDW: 12.2 % (ref 11.5–15.5)
WBC: 7.3 10*3/uL (ref 4.0–10.5)
nRBC: 0 % (ref 0.0–0.2)

## 2023-01-09 LAB — COMPREHENSIVE METABOLIC PANEL
ALT: 31 U/L (ref 0–44)
AST: 43 U/L — ABNORMAL HIGH (ref 15–41)
Albumin: 4.3 g/dL (ref 3.5–5.0)
Alkaline Phosphatase: 99 U/L (ref 38–126)
Anion gap: 10 (ref 5–15)
BUN: 16 mg/dL (ref 8–23)
CO2: 23 mmol/L (ref 22–32)
Calcium: 9.7 mg/dL (ref 8.9–10.3)
Chloride: 105 mmol/L (ref 98–111)
Creatinine, Ser: 0.92 mg/dL (ref 0.44–1.00)
GFR, Estimated: >60 mL/min (ref >60)
Glucose, Bld: 104 mg/dL — ABNORMAL HIGH (ref 70–99)
Potassium: 4.2 mmol/L (ref 3.5–5.1)
Sodium: 138 mmol/L (ref 135–145)
Total Bilirubin: 0.7 mg/dL (ref 0.3–1.2)
Total Protein: 7.6 g/dL (ref 6.5–8.1)

## 2023-01-09 LAB — SURGICAL PCR SCREEN
MRSA, PCR: NEGATIVE
Staphylococcus aureus: POSITIVE — AB

## 2023-01-10 NOTE — Progress Notes (Signed)
Please review pt's preop PCR results.

## 2023-01-18 NOTE — H&P (Signed)
TOTAL HIP ADMISSION H&P  Patient is admitted for left total hip arthroplasty.  Subjective:  Chief Complaint: left hip pain  HPI: [name], 67 y.o. female, has a history of pain and functional disability in the left hip(s) due to avascular necrosis and patient has failed non-surgical conservative treatments for greater than 12 weeks to include NSAID's and/or analgesics, use of assistive devices, and activity modification.  Onset of symptoms was abrupt starting 1 years ago with rapidlly worsening course since that time.The patient noted no past surgery on the left hip(s).  Patient currently rates pain in the left hip at 10 out of 10 with activity. Patient has night pain, worsening of pain with activity and weight bearing, pain that interfers with activities of daily living, and pain with passive range of motion. Patient has evidence of subchondral cysts by imaging studies. This condition presents safety issues increasing the risk of falls. This patient has had avascular necrosis of the hip, acetabular fracture, hip dysplasia.  There is no current active infection.  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   Avascular necrosis of bone of left hip (HCC) 02/04/2020   Past Medical History:  Diagnosis Date   Arthritis    Asthma    GERD (gastroesophageal reflux disease)     Past Surgical History:  Procedure Laterality Date   ANKLE FRACTURE SURGERY Left    APPENDECTOMY     BILATERAL TEMPOROMANDIBULAR JOINT ARTHROPLASTY     Fluid removed from lung     TONSILLECTOMY     TOTAL HIP ARTHROPLASTY Right 02/03/2022   Procedure: RIGHT TOTAL HIP ARTHROPLASTY ANTERIOR APPROACH;  Surgeon: Kathryne Hitch, MD;  Location: [facility];  Service: Orthopedics;  Laterality: Right;    No current facility-administered medications for this encounter.   Current Outpatient Medications  Medication Sig Dispense Refill Last Dose   diclofenac  Sodium (VOLTAREN ARTHRITIS PAIN) 1 % GEL Apply 2 g topically daily as needed (pain).      loperamide (IMODIUM A-D) 2 MG tablet Take 2 mg by mouth 4 (four) times daily as needed for diarrhea or loose stools.      loratadine (CLARITIN) 10 MG tablet Take 10 mg by mouth daily.      meloxicam (MOBIC) 15 MG tablet Take 1 tablet (15 mg total) by mouth daily. 30 tablet 0    naproxen sodium (ALEVE) 220 MG tablet Take 220 mg by mouth daily as needed (pain).      omeprazole (PRILOSEC) 20 MG capsule Take 20 mg by mouth as needed.      OVER THE COUNTER MEDICATION Take 3-4 capsules by mouth at bedtime. CBD 100 mg      aspirin 81 MG chewable tablet Chew 1 tablet (81 mg total) by mouth 2 (two) times daily. (Patient not taking: Reported on 01/04/2023) 30 tablet 1 Not Taking   gabapentin (NEURONTIN) 100 MG capsule Take 1 capsule (100 mg total) by mouth 3 (three) times daily. (Patient not taking: Reported on 01/04/2023) 60 capsule 1 Not Taking   ondansetron (ZOFRAN-ODT) 4 MG disintegrating tablet Take 1 tablet (4 mg total) by mouth every 6 (six) hours as needed for nausea or vomiting. (Patient not taking: Reported on 01/04/2023) 20 tablet 0 Not Taking   temazepam (RESTORIL) 7.5 MG capsule Take 1 capsule (7.5 mg total) by mouth at bedtime as needed for sleep. (Patient not taking: Reported on 01/04/2023) 30 capsule 0 Not Taking   tiZANidine (  ZANAFLEX) 4 MG tablet TAKE 1 TABLET BY MOUTH EVERY 6 HOURS AS NEEDED FOR MUSCLE SPASM 30 tablet 0    Allergies  Allergen Reactions   Hydrocodone Other (See Comments)    Reaction: Tremor (intolerance); shakey Keep her up   Codeine     "Makes me really sick."    Erythromycin     "Makes me really sick."     Social History   Tobacco Use   Smoking status: Never   Smokeless tobacco: Not on file  Substance Use Topics   Alcohol use: Yes    Alcohol/week: 1.0 standard drink of alcohol    Types: 1 Glasses of wine per week    Comment: ocass.    No family history on file.   Review  of Systems  Objective:  Physical Exam Vitals reviewed.  Constitutional:      Appearance: Normal appearance. She is normal weight.  HENT:     Head: Normocephalic and atraumatic.  Eyes:     Extraocular Movements: Extraocular movements intact.     Pupils: Pupils are equal, round, and reactive to light.  Cardiovascular:     Rate and Rhythm: Normal rate and regular rhythm.     Pulses: Normal pulses.  Pulmonary:     Effort: Pulmonary effort is normal.     Breath sounds: Normal breath sounds.  Abdominal:     Palpations: Abdomen is soft.  Musculoskeletal:     Cervical back: Normal range of motion and neck supple.     Left hip: Tenderness and bony tenderness present. Decreased range of motion. Decreased strength.  Neurological:     Mental Status: She is alert and oriented to person, place, and time.  Psychiatric:        Behavior: Behavior normal.     Vital signs in last 24 hours:    Labs:   Estimated body mass index is 24.89 kg/m as calculated from the following:   Height as of 01/09/23: 5\' 4"  (1.626 m).   Weight as of 01/09/23: 65.8 kg.   Imaging Review Plain radiographs demonstrate severe AVN of the left hip(s). The bone quality appears to be good for age and reported activity level.      Assessment/Plan:  Avascular necrosis, left hip(s)  The patient history, physical examination, clinical judgement of the provider and imaging studies are consistent with AVN of the left hip(s) and total hip arthroplasty is deemed medically necessary. The treatment options including medical management, injection therapy, arthroscopy and arthroplasty were discussed at length. The risks and benefits of total hip arthroplasty were presented and reviewed. The risks due to aseptic loosening, infection, stiffness, dislocation/subluxation,  thromboembolic complications and other imponderables were discussed.  The patient acknowledged the explanation, agreed to proceed with the plan and consent was  signed. Patient is being admitted for inpatient treatment for surgery, pain control, PT, OT, prophylactic antibiotics, VTE prophylaxis, progressive ambulation and ADL's and discharge planning.The patient is planning to be discharged home with home health services

## 2023-01-19 ENCOUNTER — Ambulatory Visit (HOSPITAL_COMMUNITY): Payer: PPO

## 2023-01-19 ENCOUNTER — Ambulatory Visit (HOSPITAL_COMMUNITY): Payer: PPO | Admitting: Certified Registered Nurse Anesthetist

## 2023-01-19 ENCOUNTER — Inpatient Hospital Stay (HOSPITAL_COMMUNITY)
Admission: RE | Admit: 2023-01-19 | Discharge: 2023-01-24 | DRG: 470 | Disposition: A | Discharge status: Home Health | Payer: PPO | Attending: Orthopaedic Surgery | Admitting: Orthopaedic Surgery

## 2023-01-19 ENCOUNTER — Other Ambulatory Visit: Payer: Self-pay

## 2023-01-19 ENCOUNTER — Ambulatory Visit (HOSPITAL_BASED_OUTPATIENT_CLINIC_OR_DEPARTMENT_OTHER): Payer: PPO | Admitting: Certified Registered Nurse Anesthetist

## 2023-01-19 ENCOUNTER — Observation Stay (HOSPITAL_COMMUNITY): Payer: PPO

## 2023-01-19 ENCOUNTER — Encounter (HOSPITAL_COMMUNITY): Payer: Self-pay | Admitting: Orthopaedic Surgery

## 2023-01-19 ENCOUNTER — Encounter (HOSPITAL_COMMUNITY)
Admission: RE | Disposition: A | Discharge status: Home Health | Payer: Self-pay | Source: Home / Self Care | Attending: Orthopaedic Surgery

## 2023-01-19 DIAGNOSIS — M87852 Other osteonecrosis, left femur: Principal | ICD-10-CM | POA: Diagnosis present

## 2023-01-19 DIAGNOSIS — Z881 Allergy status to other antibiotic agents status: Secondary | ICD-10-CM

## 2023-01-19 DIAGNOSIS — Z791 Long term (current) use of non-steroidal anti-inflammatories (NSAID): Secondary | ICD-10-CM

## 2023-01-19 DIAGNOSIS — M87052 Idiopathic aseptic necrosis of left femur: Secondary | ICD-10-CM

## 2023-01-19 DIAGNOSIS — Z79899 Other long term (current) drug therapy: Secondary | ICD-10-CM

## 2023-01-19 DIAGNOSIS — K219 Gastro-esophageal reflux disease without esophagitis: Secondary | ICD-10-CM | POA: Diagnosis present

## 2023-01-19 DIAGNOSIS — R600 Localized edema: Secondary | ICD-10-CM | POA: Diagnosis present

## 2023-01-19 DIAGNOSIS — Z7982 Long term (current) use of aspirin: Secondary | ICD-10-CM

## 2023-01-19 DIAGNOSIS — Z885 Allergy status to narcotic agent status: Secondary | ICD-10-CM

## 2023-01-19 DIAGNOSIS — M1990 Unspecified osteoarthritis, unspecified site: Secondary | ICD-10-CM | POA: Diagnosis present

## 2023-01-19 DIAGNOSIS — J45909 Unspecified asthma, uncomplicated: Secondary | ICD-10-CM | POA: Diagnosis present

## 2023-01-19 DIAGNOSIS — Z96641 Presence of right artificial hip joint: Secondary | ICD-10-CM | POA: Diagnosis present

## 2023-01-19 DIAGNOSIS — G8929 Other chronic pain: Secondary | ICD-10-CM | POA: Diagnosis present

## 2023-01-19 DIAGNOSIS — M8568 Other cyst of bone, other site: Secondary | ICD-10-CM | POA: Diagnosis present

## 2023-01-19 DIAGNOSIS — Z96642 Presence of left artificial hip joint: Secondary | ICD-10-CM

## 2023-01-19 DIAGNOSIS — M21952 Unspecified acquired deformity of left thigh: Secondary | ICD-10-CM | POA: Diagnosis present

## 2023-01-19 HISTORY — PX: TOTAL HIP ARTHROPLASTY: SHX124

## 2023-01-19 LAB — TYPE AND SCREEN
ABO/RH(D): O POS
Antibody Screen: NEGATIVE

## 2023-01-19 SURGERY — ARTHROPLASTY, HIP, TOTAL, ANTERIOR APPROACH
Anesthesia: Spinal | Site: Hip | Laterality: Left

## 2023-01-19 MED ORDER — ONDANSETRON HCL 4 MG/2ML IJ SOLN
INTRAMUSCULAR | Status: DC | PRN
  Administered 2023-01-19: 4 mg via INTRAVENOUS

## 2023-01-19 MED ORDER — PHENOL 1.4 % MT LIQD: 1.0000 | OROMUCOSAL | Status: DC | PRN

## 2023-01-19 MED ORDER — PANTOPRAZOLE SODIUM 40 MG PO TBEC
40.0000 mg | DELAYED_RELEASE_TABLET | Freq: Every day | ORAL | Status: DC
  Administered 2023-01-19 – 2023-01-24 (×6): 40 mg via ORAL
  Filled 2023-01-19 (×6): qty 1

## 2023-01-19 MED ORDER — MIDAZOLAM HCL 2 MG/2ML IJ SOLN
INTRAMUSCULAR | Status: AC
  Filled 2023-01-19: qty 2

## 2023-01-19 MED ORDER — KETOROLAC TROMETHAMINE 15 MG/ML IJ SOLN
7.5000 mg | Freq: Four times a day (QID) | INTRAMUSCULAR | Status: AC
  Administered 2023-01-19 (×3): 7.5 mg via INTRAVENOUS
  Filled 2023-01-19 (×3): qty 1

## 2023-01-19 MED ORDER — TIZANIDINE HCL 4 MG PO TABS
4.0000 mg | ORAL_TABLET | Freq: Four times a day (QID) | ORAL | Status: DC | PRN
  Administered 2023-01-19 – 2023-01-24 (×16): 4 mg via ORAL
  Filled 2023-01-19 (×16): qty 1

## 2023-01-19 MED ORDER — PHENYLEPHRINE HCL-NACL 20-0.9 MG/250ML-% IV SOLN
INTRAVENOUS | Status: DC | PRN
  Administered 2023-01-19: 50 ug/min via INTRAVENOUS

## 2023-01-19 MED ORDER — PHENYLEPHRINE HCL-NACL 20-0.9 MG/250ML-% IV SOLN
INTRAVENOUS | Status: AC
  Filled 2023-01-19: qty 250

## 2023-01-19 MED ORDER — DEXMEDETOMIDINE HCL IN NACL 80 MCG/20ML IV SOLN
INTRAVENOUS | Status: AC
  Filled 2023-01-19: qty 20

## 2023-01-19 MED ORDER — STERILE WATER FOR IRRIGATION IR SOLN
Status: DC | PRN
  Administered 2023-01-19: 2000 mL

## 2023-01-19 MED ORDER — FENTANYL CITRATE (PF) 100 MCG/2ML IJ SOLN
INTRAMUSCULAR | Status: DC | PRN
  Administered 2023-01-19: 100 ug via INTRAVENOUS

## 2023-01-19 MED ORDER — SODIUM CHLORIDE 0.9 % IV SOLN: INTRAVENOUS | Status: DC

## 2023-01-19 MED ORDER — DOCUSATE SODIUM 100 MG PO CAPS
100.0000 mg | ORAL_CAPSULE | Freq: Two times a day (BID) | ORAL | Status: DC
  Administered 2023-01-19 – 2023-01-24 (×11): 100 mg via ORAL
  Filled 2023-01-19 (×11): qty 1

## 2023-01-19 MED ORDER — METOCLOPRAMIDE HCL 5 MG PO TABS
5.0000 mg | ORAL_TABLET | Freq: Three times a day (TID) | ORAL | Status: DC | PRN
  Administered 2023-01-19: 5 mg via ORAL
  Filled 2023-01-19: qty 1

## 2023-01-19 MED ORDER — ACETAMINOPHEN 325 MG PO TABS
325.0000 mg | ORAL_TABLET | Freq: Four times a day (QID) | ORAL | Status: DC | PRN
  Administered 2023-01-20 – 2023-01-24 (×5): 650 mg via ORAL
  Filled 2023-01-19 (×5): qty 2

## 2023-01-19 MED ORDER — POVIDONE-IODINE 10 % EX SWAB: 2.0000 | Freq: Once | CUTANEOUS | Status: DC

## 2023-01-19 MED ORDER — 0.9 % SODIUM CHLORIDE (POUR BTL) OPTIME
TOPICAL | Status: DC | PRN
  Administered 2023-01-19: 1000 mL

## 2023-01-19 MED ORDER — PROMETHAZINE HCL 25 MG/ML IJ SOLN: 6.2500 mg | INTRAMUSCULAR | Status: DC | PRN

## 2023-01-19 MED ORDER — MENTHOL 3 MG MT LOZG: 1.0000 | LOZENGE | OROMUCOSAL | Status: DC | PRN

## 2023-01-19 MED ORDER — HYDROMORPHONE HCL 2 MG PO TABS
2.0000 mg | ORAL_TABLET | ORAL | Status: DC | PRN
  Administered 2023-01-19 – 2023-01-20 (×3): 3 mg via ORAL
  Administered 2023-01-22: 2 mg via ORAL
  Administered 2023-01-23 (×2): 3 mg via ORAL
  Administered 2023-01-23: 2 mg via ORAL
  Administered 2023-01-24 (×3): 3 mg via ORAL
  Filled 2023-01-19 (×2): qty 2
  Filled 2023-01-19: qty 1
  Filled 2023-01-19 (×4): qty 2
  Filled 2023-01-19: qty 1
  Filled 2023-01-19 (×2): qty 2
  Filled 2023-01-19: qty 1

## 2023-01-19 MED ORDER — PROPOFOL 500 MG/50ML IV EMUL
INTRAVENOUS | Status: DC | PRN
  Administered 2023-01-19: 100 ug/kg/min via INTRAVENOUS

## 2023-01-19 MED ORDER — HYDROMORPHONE HCL 1 MG/ML IJ SOLN
INTRAMUSCULAR | Status: AC
  Administered 2023-01-19: 0.5 mg via INTRAVENOUS
  Filled 2023-01-19: qty 1

## 2023-01-19 MED ORDER — DIPHENHYDRAMINE HCL 12.5 MG/5ML PO ELIX
12.5000 mg | ORAL_SOLUTION | ORAL | Status: DC | PRN
  Administered 2023-01-21: 25 mg via ORAL
  Filled 2023-01-19: qty 10

## 2023-01-19 MED ORDER — DEXMEDETOMIDINE HCL IN NACL 80 MCG/20ML IV SOLN
INTRAVENOUS | Status: DC | PRN
  Administered 2023-01-19: 4 ug via INTRAVENOUS

## 2023-01-19 MED ORDER — AMISULPRIDE (ANTIEMETIC) 5 MG/2ML IV SOLN
INTRAVENOUS | Status: AC
  Filled 2023-01-19: qty 2

## 2023-01-19 MED ORDER — ACETAMINOPHEN 160 MG/5ML PO SOLN: 325.0000 mg | Freq: Once | ORAL | Status: DC | PRN

## 2023-01-19 MED ORDER — METOCLOPRAMIDE HCL 5 MG/ML IJ SOLN: 5.0000 mg | Freq: Three times a day (TID) | INTRAMUSCULAR | Status: DC | PRN

## 2023-01-19 MED ORDER — OXYCODONE HCL 5 MG PO TABS
5.0000 mg | ORAL_TABLET | ORAL | Status: DC | PRN
  Administered 2023-01-19 – 2023-01-24 (×21): 10 mg via ORAL
  Filled 2023-01-19 (×21): qty 2

## 2023-01-19 MED ORDER — CHLORHEXIDINE GLUCONATE 0.12 % MT SOLN
15.0000 mL | Freq: Once | OROMUCOSAL | Status: AC
  Administered 2023-01-19: 15 mL via OROMUCOSAL

## 2023-01-19 MED ORDER — GABAPENTIN 100 MG PO CAPS
100.0000 mg | ORAL_CAPSULE | Freq: Three times a day (TID) | ORAL | Status: DC
  Administered 2023-01-19 – 2023-01-24 (×16): 100 mg via ORAL
  Filled 2023-01-19 (×16): qty 1

## 2023-01-19 MED ORDER — ONDANSETRON HCL 4 MG/2ML IJ SOLN
4.0000 mg | Freq: Four times a day (QID) | INTRAMUSCULAR | Status: DC | PRN
  Administered 2023-01-19 – 2023-01-23 (×4): 4 mg via INTRAVENOUS
  Filled 2023-01-19 (×5): qty 2

## 2023-01-19 MED ORDER — CEFAZOLIN SODIUM-DEXTROSE 2-4 GM/100ML-% IV SOLN
2.0000 g | INTRAVENOUS | Status: AC
  Administered 2023-01-19: 2 g via INTRAVENOUS
  Filled 2023-01-19: qty 100

## 2023-01-19 MED ORDER — MIDAZOLAM HCL 5 MG/5ML IJ SOLN
INTRAMUSCULAR | Status: DC | PRN
  Administered 2023-01-19: 2 mg via INTRAVENOUS

## 2023-01-19 MED ORDER — BUPIVACAINE IN DEXTROSE 0.75-8.25 % IT SOLN
INTRATHECAL | Status: DC | PRN
  Administered 2023-01-19: 1.6 mL via INTRATHECAL

## 2023-01-19 MED ORDER — LACTATED RINGERS IV SOLN: INTRAVENOUS | Status: DC

## 2023-01-19 MED ORDER — AMISULPRIDE (ANTIEMETIC) 5 MG/2ML IV SOLN
10.0000 mg | Freq: Once | INTRAVENOUS | Status: AC | PRN
  Administered 2023-01-19: 10 mg via INTRAVENOUS

## 2023-01-19 MED ORDER — ZOLPIDEM TARTRATE 5 MG PO TABS
5.0000 mg | ORAL_TABLET | Freq: Every evening | ORAL | Status: DC | PRN
  Administered 2023-01-20 – 2023-01-23 (×4): 5 mg via ORAL
  Filled 2023-01-19 (×4): qty 1

## 2023-01-19 MED ORDER — PROPOFOL 10 MG/ML IV BOLUS
INTRAVENOUS | Status: DC | PRN
  Administered 2023-01-19: 10 mg via INTRAVENOUS
  Administered 2023-01-19 (×2): 20 mg via INTRAVENOUS

## 2023-01-19 MED ORDER — SODIUM CHLORIDE 0.9 % IR SOLN
Status: DC | PRN
  Administered 2023-01-19: 1000 mL

## 2023-01-19 MED ORDER — HYDROMORPHONE HCL 1 MG/ML IJ SOLN
0.2500 mg | INTRAMUSCULAR | Status: DC | PRN
  Administered 2023-01-19 (×2): 0.5 mg via INTRAVENOUS

## 2023-01-19 MED ORDER — PROPOFOL 1000 MG/100ML IV EMUL
INTRAVENOUS | Status: AC
  Filled 2023-01-19: qty 100

## 2023-01-19 MED ORDER — ALUM & MAG HYDROXIDE-SIMETH 200-200-20 MG/5ML PO SUSP: 30.0000 mL | ORAL | Status: DC | PRN

## 2023-01-19 MED ORDER — ONDANSETRON HCL 4 MG PO TABS
4.0000 mg | ORAL_TABLET | Freq: Four times a day (QID) | ORAL | Status: DC | PRN
  Administered 2023-01-19 – 2023-01-24 (×6): 4 mg via ORAL
  Filled 2023-01-19 (×6): qty 1

## 2023-01-19 MED ORDER — ONDANSETRON HCL 4 MG/2ML IJ SOLN
INTRAMUSCULAR | Status: AC
  Filled 2023-01-19: qty 2

## 2023-01-19 MED ORDER — ACETAMINOPHEN 325 MG PO TABS: 325.0000 mg | ORAL_TABLET | Freq: Once | ORAL | Status: DC | PRN

## 2023-01-19 MED ORDER — ACETAMINOPHEN 10 MG/ML IV SOLN: 1000.0000 mg | Freq: Once | INTRAVENOUS | Status: DC | PRN

## 2023-01-19 MED ORDER — CEFAZOLIN SODIUM-DEXTROSE 1-4 GM/50ML-% IV SOLN
1.0000 g | Freq: Four times a day (QID) | INTRAVENOUS | Status: AC
  Administered 2023-01-19 (×2): 1 g via INTRAVENOUS
  Filled 2023-01-19 (×2): qty 50

## 2023-01-19 MED ORDER — TRANEXAMIC ACID-NACL 1000-0.7 MG/100ML-% IV SOLN
1000.0000 mg | INTRAVENOUS | Status: AC
  Administered 2023-01-19: 1000 mg via INTRAVENOUS
  Filled 2023-01-19: qty 100

## 2023-01-19 MED ORDER — ORAL CARE MOUTH RINSE: 15.0000 mL | Freq: Once | OROMUCOSAL | Status: AC

## 2023-01-19 MED ORDER — ASPIRIN 81 MG PO CHEW
81.0000 mg | CHEWABLE_TABLET | Freq: Two times a day (BID) | ORAL | Status: DC
  Administered 2023-01-19 – 2023-01-24 (×10): 81 mg via ORAL
  Filled 2023-01-19 (×10): qty 1

## 2023-01-19 MED ORDER — DEXAMETHASONE SODIUM PHOSPHATE 10 MG/ML IJ SOLN
INTRAMUSCULAR | Status: AC
  Filled 2023-01-19: qty 1

## 2023-01-19 MED ORDER — FENTANYL CITRATE (PF) 100 MCG/2ML IJ SOLN
INTRAMUSCULAR | Status: AC
  Filled 2023-01-19: qty 2

## 2023-01-19 MED ORDER — HYDROMORPHONE HCL 1 MG/ML IJ SOLN
0.5000 mg | INTRAMUSCULAR | Status: DC | PRN
  Administered 2023-01-19 – 2023-01-23 (×15): 1 mg via INTRAVENOUS
  Filled 2023-01-19 (×17): qty 1

## 2023-01-19 MED ORDER — LORATADINE 10 MG PO TABS
10.0000 mg | ORAL_TABLET | Freq: Every day | ORAL | Status: DC
  Administered 2023-01-19 – 2023-01-24 (×6): 10 mg via ORAL
  Filled 2023-01-19 (×6): qty 1

## 2023-01-19 MED ORDER — DEXAMETHASONE SODIUM PHOSPHATE 10 MG/ML IJ SOLN
INTRAMUSCULAR | Status: DC | PRN
  Administered 2023-01-19: 10 mg via INTRAVENOUS

## 2023-01-19 SURGICAL SUPPLY — 41 items
APL SKNCLS STERI-STRIP NONHPOA (GAUZE/BANDAGES/DRESSINGS)
BAG COUNTER SPONGE SURGICOUNT (BAG) ×1 IMPLANT
BAG SPEC THK2 15X12 ZIP CLS (MISCELLANEOUS)
BAG SPNG CNTER NS LX DISP (BAG) ×1
BAG ZIPLOCK 12X15 (MISCELLANEOUS) IMPLANT
BENZOIN TINCTURE PRP APPL 2/3 (GAUZE/BANDAGES/DRESSINGS) IMPLANT
BLADE SAW SGTL 18X1.27X75 (BLADE) ×1 IMPLANT
COVER PERINEAL POST (MISCELLANEOUS) ×1 IMPLANT
COVER SURGICAL LIGHT HANDLE (MISCELLANEOUS) ×1 IMPLANT
DRAPE FOOT SWITCH (DRAPES) ×1 IMPLANT
DRAPE STERI IOBAN 125X83 (DRAPES) ×1 IMPLANT
DRAPE U-SHAPE 47X51 STRL (DRAPES) ×2 IMPLANT
DRSG AQUACEL AG ADV 3.5X10 (GAUZE/BANDAGES/DRESSINGS) ×1 IMPLANT
DURAPREP 26ML APPLICATOR (WOUND CARE) ×1 IMPLANT
ELECT REM PT RETURN 15FT ADLT (MISCELLANEOUS) ×1 IMPLANT
GAUZE XEROFORM 1X8 LF (GAUZE/BANDAGES/DRESSINGS) IMPLANT
GLOVE BIO SURGEON STRL SZ7.5 (GLOVE) ×1 IMPLANT
GLOVE BIOGEL PI IND STRL 8 (GLOVE) ×2 IMPLANT
GLOVE ECLIPSE 8.0 STRL XLNG CF (GLOVE) ×1 IMPLANT
GOWN STRL REUS W/ TWL XL LVL3 (GOWN DISPOSABLE) ×2 IMPLANT
GOWN STRL REUS W/TWL XL LVL3 (GOWN DISPOSABLE) ×2
HANDPIECE INTERPULSE COAX TIP (DISPOSABLE) ×1
HEAD CERAMIC DELTA 36 PLUS 1.5 (Hips) IMPLANT
HOLDER FOLEY CATH W/STRAP (MISCELLANEOUS) ×1 IMPLANT
KIT TURNOVER KIT A (KITS) IMPLANT
LINER NEUTRAL 52X36MM PLUS 4 (Liner) IMPLANT
PACK ANTERIOR HIP CUSTOM (KITS) ×1 IMPLANT
PIN SECTOR W/GRIP ACE CUP 52MM (Hips) IMPLANT
SET HNDPC FAN SPRY TIP SCT (DISPOSABLE) ×1 IMPLANT
STAPLER VISISTAT 35W (STAPLE) IMPLANT
STEM FEMORAL SZ5 HIGH ACTIS (Stem) IMPLANT
STRIP CLOSURE SKIN 1/2X4 (GAUZE/BANDAGES/DRESSINGS) IMPLANT
SUT ETHIBOND NAB CT1 #1 30IN (SUTURE) ×1 IMPLANT
SUT ETHILON 2 0 PS N (SUTURE) IMPLANT
SUT MNCRL AB 4-0 PS2 18 (SUTURE) IMPLANT
SUT VIC AB 0 CT1 36 (SUTURE) ×1 IMPLANT
SUT VIC AB 1 CT1 36 (SUTURE) ×1 IMPLANT
SUT VIC AB 2-0 CT1 27 (SUTURE) ×2
SUT VIC AB 2-0 CT1 TAPERPNT 27 (SUTURE) ×2 IMPLANT
TRAY FOLEY MTR SLVR 16FR STAT (SET/KITS/TRAYS/PACK) IMPLANT
YANKAUER SUCT BULB TIP NO VENT (SUCTIONS) ×1 IMPLANT

## 2023-01-19 NOTE — Anesthesia Procedure Notes (Signed)
Procedure Name: MAC Date/Time: 01/19/2023 7:10 AM  Performed by: Orest Dikes, CRNAPre-anesthesia Checklist: Patient identified, Emergency Drugs available, Suction available and Patient being monitored Oxygen Delivery Method: Simple face mask

## 2023-01-19 NOTE — TOC Transition Note (Signed)
Transition of Care [facility]) - CM/SW Discharge Note   Patient Details  Name: Heidi [name] MRN: [id number] Date of Birth: January 15, 1956  [facility] CM/SW Contact:  Amada Jupiter, LCSW Phone Number: 01/19/2023, 1:22 PM   Clinical Narrative:     Met with pt who confirms she has needed DME in the home.  HHPT prearranged with [facility].  No further TOC needs.  Final next level of care: Home w Home Health Services Barriers to Discharge: No Barriers Identified   Patient Goals and CMS Choice      Discharge Placement                         Discharge Plan and Services Additional resources added to the After Visit Summary for                  DME Arranged: N/A DME Agency: NA       HH Arranged: PT HH Agency: [facility]        Social Determinants of Health (SDOH) Interventions SDOH Screenings   Food Insecurity: No Food Insecurity (01/19/2023)  Housing: Low Risk  (01/19/2023)  Transportation Needs: No Transportation Needs (01/19/2023)  Utilities: Not At Risk (01/19/2023)  Tobacco Use: Unknown (01/19/2023)     Readmission Risk Interventions     No data to display

## 2023-01-19 NOTE — Anesthesia Procedure Notes (Signed)
Spinal  Patient location during procedure: OR Start time: 01/19/2023 7:17 AM End time: 01/19/2023 7:19 AM Reason for block: surgical anesthesia Staffing Performed: resident/CRNA  Anesthesiologist: Shelton Silvas, MD Resident/CRNA: Doran Clay, CRNA Performed by: Doran Clay, CRNA Authorized by: Shelton Silvas, MD   Preanesthetic Checklist Completed: patient identified, IV checked, site marked, risks and benefits discussed, surgical consent, monitors and equipment checked, pre-op evaluation and timeout performed Spinal Block Patient position: sitting Prep: DuraPrep Patient monitoring: heart rate, cardiac monitor, continuous pulse ox and blood pressure Approach: midline Location: L3-4 Injection technique: single-shot Needle Needle type: Pencan  Needle gauge: 24 G Needle length: 10 cm Needle insertion depth: 7 cm Assessment Sensory level: T6 Events: CSF return Additional Notes Timeout performed. Patient in sitting position. L3-4 identified. Cleansed with Duraprep. SAB without difficulty. To supine position

## 2023-01-19 NOTE — Interval H&P Note (Signed)
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  There has been no acute or interval change in her medical status.  The risks and benefits of surgery been discussed in detail and informed consent has been obtained.  The left operative hip has been marked.  01/19/2023 7:04 AM  Baldwin Jamaica  has presented today for surgery, with the diagnosis of left hip avascular necrosis.  The various methods of treatment have been discussed with the patient and family. After consideration of risks, benefits and other options for treatment, the patient has consented to  Procedure(s): LEFT TOTAL HIP ARTHROPLASTY ANTERIOR APPROACH (Left) as a surgical intervention.  The patient's history has been reviewed, patient examined, no change in status, stable for surgery.  I have reviewed the patient's chart and labs.  Questions were answered to the patient's satisfaction.     Kathryne Hitch

## 2023-01-19 NOTE — Anesthesia Postprocedure Evaluation (Signed)
Anesthesia Post Note  Patient: Heidi [name]  Procedure(s) Performed: LEFT TOTAL HIP ARTHROPLASTY ANTERIOR APPROACH (Left: Hip)     Patient location during evaluation: PACU Anesthesia Type: Spinal Level of consciousness: oriented and awake and alert Pain management: pain level controlled Vital Signs Assessment: post-procedure vital signs reviewed and stable Respiratory status: spontaneous breathing, respiratory function stable and patient connected to nasal cannula oxygen Cardiovascular status: blood pressure returned to baseline and stable Postop Assessment: no headache, no backache and no apparent nausea or vomiting Anesthetic complications: no  No notable events documented.  Last Vitals:  Vitals:   01/19/23 1111 01/19/23 1309  BP: (!) 148/85 136/82  Pulse: 93 91  Resp: 16 15  Temp: 36.7 C 36.6 C  SpO2: 94% 96%    Last Pain:  Vitals:   01/19/23 1354  TempSrc:   PainSc: 9                  Shelton Silvas

## 2023-01-19 NOTE — Transfer of Care (Signed)
Immediate Anesthesia Transfer of Care Note  Patient: Heidi [name]  Procedure(s) Performed: LEFT TOTAL HIP ARTHROPLASTY ANTERIOR APPROACH (Left: Hip)  Patient Location: PACU  Anesthesia Type:Spinal  Level of Consciousness: awake, alert , and oriented  Airway & Oxygen Therapy: Patient Spontanous Breathing and Patient connected to face mask oxygen  Post-op Assessment: Report given to RN and Post -op Vital signs reviewed and stable  Post vital signs: Reviewed and stable  Last Vitals:  Vitals Value Taken Time  BP 86/69 01/19/23 0836  Temp    Pulse 86 01/19/23 0837  Resp 15 01/19/23 0837  SpO2 100 % 01/19/23 0837  Vitals shown include unfiled device data.  Last Pain:  Vitals:   01/19/23 0605  TempSrc: Oral  PainSc:          Complications: No notable events documented.

## 2023-01-19 NOTE — Anesthesia Preprocedure Evaluation (Addendum)
Anesthesia Evaluation  Patient identified by MRN, date of birth, ID band Patient awake    Reviewed: Allergy & Precautions, NPO status , Patient's Chart, lab work & pertinent test results  Airway Mallampati: II  TM Distance: <3 FB Neck ROM: Full    Dental  (+) Edentulous Upper   Pulmonary asthma    breath sounds clear to auscultation       Cardiovascular  Rhythm:Regular Rate:Normal     Neuro/Psych negative neurological ROS  negative psych ROS   GI/Hepatic Neg liver ROS,GERD  ,,  Endo/Other  negative endocrine ROS    Renal/GU negative Renal ROS     Musculoskeletal  (+) Arthritis ,    Abdominal   Peds  Hematology negative hematology ROS (+)   Anesthesia Other Findings   Reproductive/Obstetrics                             Anesthesia Physical Anesthesia Plan  ASA: 2  Anesthesia Plan: Spinal   Post-op Pain Management: Tylenol PO (pre-op)*   Induction: Intravenous  PONV Risk Score and Plan: 3 and Ondansetron, Dexamethasone, Propofol infusion and Midazolam  Airway Management Planned: Natural Airway and Simple Face Mask  Additional Equipment: None  Intra-op Plan:   Post-operative Plan: Extubation in OR  Informed Consent: I have reviewed the patients History and Physical, chart, labs and discussed the procedure including the risks, benefits and alternatives for the proposed anesthesia with the patient or authorized representative who has indicated his/her understanding and acceptance.     Dental advisory given  Plan Discussed with: CRNA  Anesthesia Plan Comments: (Lab Results      Component                Value               Date                      WBC                      7.3                 01/09/2023                HGB                      14.7                01/09/2023                HCT                      44.2                01/09/2023                MCV                       109.7 (H)           01/09/2023                PLT                      176                 01/09/2023           )  Anesthesia Quick Evaluation

## 2023-01-19 NOTE — Op Note (Signed)
Operative Note  Date of operation: 01/19/2023 Preoperative diagnosis: Left hip avascular necrosis Postoperative diagnosis: Same  Procedure: Left direct anterior total hip arthroplasty  Implants: Implant Name Type Inv. Item Serial No. Manufacturer Lot No. LRB No. Used Action  PIN SECTOR W/GRIP ACE CUP - [id number] Hips PIN SECTOR W/GRIP ACE CUP  [employer]PU[employer] [id number] Left 1 Implanted  LINER NEUTRAL 52X36MM PLUS 4 - [id number] Liner LINER NEUTRAL [id number] PLUS 4  DEPUY ORTH[employer] [id number] Left 1 Implanted  STEM FEMORAL SZ5 HIGH ACTIS - [id number] Stem STEM FEMORAL SZ5 HIGH ACTIS  [employer]PU[employer]TH[employer] [id number] Left 1 Implanted  HEAD CERAMIC DELTA 36 PLUS 1.5 - [id number] Hips HEAD CERAMIC DELTA 36 PLUS 1.5  [employer]PU[employer] [id number] Left 1 Implanted   Surgeon: Vanita Panda. Magnus Ivan, MD Assistant: Rexene Edison, PA-C  Anesthesia: Spinal Antibiotics: IV Ancef EBL: 100 cc Complications: None  Indications: The patient is a 67 year old female well-known to me.  She has bilateral hip AVN.  We actually replaced her right hip within the last year.  She now presents to have her left hip replaced.  She has femoral head collapse.  She understands fully why we are recommending the surgery.  Conservative treatment at this point is not warranted given the femoral head collapse.  She understands there is risk of acute blood loss anemia, nerve vessel injury, fracture, infection, dislocation, DVT, implant failure, leg length differences and wound healing issues.  She understands her goals are hopefully decrease pain, improve mobility, and improve quality of life.  Procedure description: After informed consent was obtained and the appropriate left hip was marked, the patient was brought to the operating room and sat up on the stretcher where spinal anesthesia was obtained.  She was then laid in a supine position on the stretcher and Foley catheter was placed.  Traction boots were placed on both her  feet and she was placed supine on the Hana fracture table with a perineal post in place in both legs and inline skeletal traction devices but no traction applied.  Her left operative hip and pelvis were assessed radiographically.  The left hip was then prepped and draped in DuraPrep and sterile drapes.  A timeout was called and she was identified as the correct patient and the correct left hip.  An incision was then made just inferior and posterior the ASIS and carried slightly obliquely down the leg.  Dissection was carried down to the tensor fascia lata muscle and the tensor fascia was then divided longitudinally to proceed with a direct interposed the hip.  Circumflex vessels were identified and cauterized.  The hip capsule identified and opened up in L-type format finding a large joint effusion.  Cobra retractors were placed around the medial lateral femoral neck and a femoral neck cut was made with an oscillating saw just proximal to the lesser trochanter.  This was completed with an osteotome.  A corkscrew guide was placed in the femoral head and the femoral head was removed in entirety.  There was significant loose bodies within the acetabulum and complete collapse of the femoral head consistent with AVN.  Remnants of the acetabular labrum and other debris removed.  A bent Hohmann was placed over the medial Oster rim and then reaming was initiated from a size 43 reamer and stepwise increments going up to a size 51 reamer with all reamers placed under direct visualization and the last reamer was also placed under direct fluoroscopy in order to obtain the depth of reaming, the  inclination and anteversion.  The real DePuy sector GRIPTION acetabular component size 52 was then placed without difficulty under visualization and fluoroscopy.  We then placed a 36+4 polythene liner based on medialization of the cup and offset.  Attention was then turned to the femur.  With the left leg externally rotated to 120  degrees, extended and adducted, a Mueller retractor was placed medially and a Hohmann retractor was placed behind the greater trochanter.  A box cutting osteotome was used into the femoral canal.  The lateral joint capsule was released.  Broaching was then initiated using the Actis broaching system from a size 0 going to a size 5.  With a size 5 in place we trialed a high offset femoral neck and a 36+1.5 trial hip ball.  The leg was brought over and up and with traction and internal rotation reduced in the pelvis.  Please leg length, offset, range of motion and stability assessed radiographically and clinically.  We then dislocated the hip and with the trial components.  We replaced the real Actis femoral component with high offset size 5 and the real 36+1.5 ceramic hip ball.  Again this reduced and acetabulum we are pleased overall.  We assessed it radiographically mechanically.  The soft tissue was then irrigated with normal saline solution.  The joint capsule was closed with interrupted #1 Ethibond suture followed by #1 Vicryl to close the tensor fascia.  0 Vicryl was used as a deep tissue and 2-0 Vicryl was used to close subcutaneous tissue.  The skin was closed with staples.  An Aquacel dressing was applied.  The patient was taken off the Hana table and taken to the recovery room in stable condition.  Rexene Edison, PA-C did assist during the entire case and beginning to end and his assistance was crucial and medically necessary for soft tissue management and retraction, helping guide implant placement and a layered closure of the wound.

## 2023-01-20 LAB — CBC
HCT: 31.5 % — ABNORMAL LOW (ref 36.0–46.0)
Hemoglobin: 10.2 g/dL — ABNORMAL LOW (ref 12.0–15.0)
MCH: 35.7 pg — ABNORMAL HIGH (ref 26.0–34.0)
MCHC: 32.4 g/dL (ref 30.0–36.0)
MCV: 110.1 fL — ABNORMAL HIGH (ref 80.0–100.0)
Platelets: 152 10*3/uL (ref 150–400)
RBC: 2.86 MIL/uL — ABNORMAL LOW (ref 3.87–5.11)
RDW: 12.2 % (ref 11.5–15.5)
WBC: 6.4 10*3/uL (ref 4.0–10.5)
nRBC: 0 % (ref 0.0–0.2)

## 2023-01-20 NOTE — Evaluation (Signed)
Physical Therapy Evaluation Patient Details Name: Heidi [name] MRN: [id number] DOB: 1956-03-02 Today's Date: 01/20/2023  History of Present Illness  Pt s/p L THR and with hx of R THR  Clinical Impression  Pt s/p L THR and presents with functional mobility limitations 2* decreased L LE strength/ROM and post op pain limiting functional mobility.  Pt should progress to dc home with family assist.      Assistance Recommended at Discharge Intermittent Supervision/Assistance  If plan is discharge home, recommend the following:  Can travel by private vehicle  A little help with walking and/or transfers;A little help with bathing/dressing/bathroom;Assistance with cooking/housework;Assist for transportation;Help with stairs or ramp for entrance        Equipment Recommendations None recommended by PT  Recommendations for Other Services       Functional Status Assessment Patient has had a recent decline in their functional status and demonstrates the ability to make significant improvements in function in a reasonable and predictable amount of time.     Precautions / Restrictions Precautions Precautions: Fall Restrictions Weight Bearing Restrictions: No      Mobility  Bed Mobility Overal bed mobility: Needs Assistance Bed Mobility: Supine to Sit     Supine to sit: Min assist, Mod assist     General bed mobility comments: Increased time with use of bed pad to assist    Transfers Overall transfer level: Needs assistance Equipment used: Rolling walker (2 wheels) Transfers: Sit to/from Stand, Bed to chair/wheelchair/BSC Sit to Stand: Min assist, From elevated surface   Step pivot transfers: Min assist       General transfer comment: cues for LE management and use of UEs to self assist.  Step pvt bed to recliner    Ambulation/Gait               General Gait Details: Step pvt bed to recliner only - pain limited  Stairs            Wheelchair Mobility      Tilt Bed    Modified Rankin (Stroke Patients Only)       Balance Overall balance assessment: Needs assistance Sitting-balance support: No upper extremity supported, Feet supported Sitting balance-Leahy Scale: Fair     Standing balance support: Bilateral upper extremity supported Standing balance-Leahy Scale: Poor                               Pertinent Vitals/Pain Pain Assessment Pain Assessment: 0-10 Pain Score: 8  Pain Location: L hip Pain Descriptors / Indicators: Aching, Sore Pain Intervention(s): Limited activity within patient's tolerance, Monitored during session, Premedicated before session, Ice applied    Home Living Family/patient expects to be discharged to:: Private residence Living Arrangements: Children Available Help at Discharge: Family Type of Home: House Home Access: Stairs to enter   Secretary/administrator of Steps: 1   Home Layout: One level Home Equipment: Rollator (4 wheels);Shower seat - built Charity fundraiser (2 wheels)      Prior Function Prior Level of Function : Independent/Modified Independent             Mobility Comments: Rollator x3 year in home and community dwellings ADLs Comments: Mod I     Hand Dominance   Dominant Hand: Right    Extremity/Trunk Assessment   Upper Extremity Assessment Upper Extremity Assessment: Overall WFL for tasks assessed    Lower Extremity Assessment Lower Extremity Assessment: LLE deficits/detail    Cervical /  Trunk Assessment Cervical / Trunk Assessment: Normal  Communication   Communication: No difficulties  Cognition Arousal/Alertness: Awake/alert Behavior During Therapy: WFL for tasks assessed/performed Overall Cognitive Status: Within Functional Limits for tasks assessed                                          General Comments      Exercises Total Joint Exercises Ankle Circles/Pumps: AROM, Both, 15 reps, Supine   Assessment/Plan    PT  Assessment Patient needs continued PT services  PT Problem List Decreased strength;Decreased range of motion;Decreased activity tolerance;Decreased balance;Decreased mobility;Decreased knowledge of use of DME       PT Treatment Interventions DME instruction;Gait training;Stair training;Functional mobility training;Therapeutic activities;Therapeutic exercise    PT Goals (Current goals can be found in the Care Plan section)  Acute Rehab PT Goals Patient Stated Goal: Regain IND; less pain PT Goal Formulation: With patient Time For Goal Achievement: 02/03/23 Potential to Achieve Goals: Good    Frequency 7X/week     Co-evaluation               AM-PAC PT "6 Clicks" Mobility  Outcome Measure Help needed turning from your back to your side while in a flat bed without using bedrails?: A Little Help needed moving from lying on your back to sitting on the side of a flat bed without using bedrails?: A Little Help needed moving to and from a bed to a chair (including a wheelchair)?: A Little Help needed standing up from a chair using your arms (e.g., wheelchair or bedside chair)?: A Little Help needed to walk in hospital room?: A Lot Help needed climbing 3-5 steps with a railing? : Total 6 Click Score: 15    End of Session Equipment Utilized During Treatment: Gait belt Activity Tolerance: Patient limited by pain Patient left: in chair;with call bell/phone within reach;with chair alarm set Nurse Communication: Mobility status PT Visit Diagnosis: Difficulty in walking, not elsewhere classified (R26.2);Pain Pain - Right/Left: Left Pain - part of body: Hip    Time: 1205-1222 PT Time Calculation (min) (ACUTE ONLY): 17 min   Charges:   PT Evaluation $PT Eval Low Complexity: 1 Low   PT General Charges $$ ACUTE PT VISIT: 1 Visit         Mauro Kaufmann PT Acute Rehabilitation Services Pager [phone] Office [phone]   Kesa Birky 01/20/2023, 1:23 PM

## 2023-01-20 NOTE — Discharge Instructions (Signed)

## 2023-01-20 NOTE — Progress Notes (Signed)
Physical Therapy Treatment Patient Details Name: Heidi [name] MRN: [id number] DOB: October 31, 1955 Today's Date: 01/20/2023   History of Present Illness Pt s/p L THR and with hx of R THR    PT Comments  Pt continues cooperative but pain limited and assisted from chair to take several steps to return to bed, position for comfort and ice packs provided.     Assistance Recommended at Discharge Intermittent Supervision/Assistance  If plan is discharge home, recommend the following:  Can travel by private vehicle    A little help with walking and/or transfers;A little help with bathing/dressing/bathroom;Assistance with cooking/housework;Assist for transportation;Help with stairs or ramp for entrance      Equipment Recommendations  None recommended by PT    Recommendations for Other Services       Precautions / Restrictions Precautions Precautions: Fall Restrictions Weight Bearing Restrictions: No     Mobility  Bed Mobility Overal bed mobility: Needs Assistance Bed Mobility: Sit to Supine     Supine to sit: Min assist, Mod assist Sit to supine: Min assist, Mod assist   General bed mobility comments: Increased time with cues for sequence and assist to manage bil LEs    Transfers Overall transfer level: Needs assistance Equipment used: Rolling walker (2 wheels) Transfers: Sit to/from Stand, Bed to chair/wheelchair/BSC Sit to Stand: Min assist, From elevated surface   Step pivot transfers: Min assist       General transfer comment: cues for LE management and use of UEs to self assist.  Step pvt recliner to bed    Ambulation/Gait               General Gait Details: Step pvt only - pain limited   Stairs             Wheelchair Mobility     Tilt Bed    Modified Rankin (Stroke Patients Only)       Balance Overall balance assessment: Needs assistance Sitting-balance support: No upper extremity supported, Feet supported Sitting balance-Leahy Scale:  Fair     Standing balance support: Bilateral upper extremity supported Standing balance-Leahy Scale: Poor                              Cognition Arousal/Alertness: Awake/alert Behavior During Therapy: WFL for tasks assessed/performed Overall Cognitive Status: Within Functional Limits for tasks assessed                                          Exercises Total Joint Exercises Ankle Circles/Pumps: AROM, Both, 15 reps, Supine    General Comments        Pertinent Vitals/Pain Pain Assessment Pain Assessment: 0-10 Pain Score: 8  Pain Location: L hip Pain Descriptors / Indicators: Aching, Sore Pain Intervention(s): Limited activity within patient's tolerance, Monitored during session, Premedicated before session, Ice applied, Patient requesting pain meds-RN notified    Home Living                          Prior Function            PT Goals (current goals can now be found in the care plan section) Acute Rehab PT Goals Patient Stated Goal: Regain IND; less pain PT Goal Formulation: With patient Time For Goal Achievement: 02/03/23 Potential to Achieve Goals: Good Progress towards PT  goals: Progressing toward goals    Frequency    7X/week      PT Plan Current plan remains appropriate    Co-evaluation              AM-PAC PT "6 Clicks" Mobility   Outcome Measure  Help needed turning from your back to your side while in a flat bed without using bedrails?: A Little Help needed moving from lying on your back to sitting on the side of a flat bed without using bedrails?: A Little Help needed moving to and from a bed to a chair (including a wheelchair)?: A Little Help needed standing up from a chair using your arms (e.g., wheelchair or bedside chair)?: A Little Help needed to walk in hospital room?: A Lot Help needed climbing 3-5 steps with a railing? : Total 6 Click Score: 15    End of Session Equipment Utilized During  Treatment: Gait belt Activity Tolerance: Patient limited by pain Patient left: in chair;with call bell/phone within reach;with chair alarm set Nurse Communication: Mobility status PT Visit Diagnosis: Difficulty in walking, not elsewhere classified (R26.2);Pain Pain - Right/Left: Left Pain - part of body: Hip     Time: 1610-9604 PT Time Calculation (min) (ACUTE ONLY): 13 min  Charges:    $Therapeutic Activity: 8-22 mins PT General Charges $$ ACUTE PT VISIT: 1 Visit                     Mauro Kaufmann PT Acute Rehabilitation Services Pager [phone] Office [phone]    Heidi [name] 01/20/2023, 4:08 PM

## 2023-01-20 NOTE — Progress Notes (Signed)
Unable to relieve patient pain during the evening shift, patient stated the only thing that helps her pain is the IV pain medication. Upon rounding this a.m. patient face was red and tears coming from her eyes. Last dose of medication was given at 0350 IV. Patient did not sleep during the night evening after repositioning, ice applied to site, relaxing sounds from TV. Patient continually moaned during the shift even with pain medications given.

## 2023-01-20 NOTE — Plan of Care (Signed)
  Problem: Coping: Goal: Level of anxiety will decrease Outcome: Progressing   Problem: Pain Managment: Goal: General experience of comfort will improve Outcome: Progressing   Problem: Safety: Goal: Ability to remain free from injury will improve Outcome: Progressing   

## 2023-01-21 DIAGNOSIS — M1990 Unspecified osteoarthritis, unspecified site: Secondary | ICD-10-CM | POA: Diagnosis present

## 2023-01-21 DIAGNOSIS — M21952 Unspecified acquired deformity of left thigh: Secondary | ICD-10-CM | POA: Diagnosis present

## 2023-01-21 DIAGNOSIS — Z791 Long term (current) use of non-steroidal anti-inflammatories (NSAID): Secondary | ICD-10-CM | POA: Diagnosis not present

## 2023-01-21 DIAGNOSIS — Z881 Allergy status to other antibiotic agents status: Secondary | ICD-10-CM | POA: Diagnosis not present

## 2023-01-21 DIAGNOSIS — M8568 Other cyst of bone, other site: Secondary | ICD-10-CM | POA: Diagnosis present

## 2023-01-21 DIAGNOSIS — G8929 Other chronic pain: Secondary | ICD-10-CM | POA: Diagnosis present

## 2023-01-21 DIAGNOSIS — R600 Localized edema: Secondary | ICD-10-CM | POA: Diagnosis present

## 2023-01-21 DIAGNOSIS — Z96641 Presence of right artificial hip joint: Secondary | ICD-10-CM | POA: Diagnosis present

## 2023-01-21 DIAGNOSIS — Z7982 Long term (current) use of aspirin: Secondary | ICD-10-CM | POA: Diagnosis not present

## 2023-01-21 DIAGNOSIS — Z885 Allergy status to narcotic agent status: Secondary | ICD-10-CM | POA: Diagnosis not present

## 2023-01-21 DIAGNOSIS — M87852 Other osteonecrosis, left femur: Secondary | ICD-10-CM | POA: Diagnosis present

## 2023-01-21 DIAGNOSIS — K219 Gastro-esophageal reflux disease without esophagitis: Secondary | ICD-10-CM | POA: Diagnosis present

## 2023-01-21 DIAGNOSIS — J45909 Unspecified asthma, uncomplicated: Secondary | ICD-10-CM | POA: Diagnosis present

## 2023-01-21 DIAGNOSIS — Z79899 Other long term (current) drug therapy: Secondary | ICD-10-CM | POA: Diagnosis not present

## 2023-01-21 MED ORDER — KETOROLAC TROMETHAMINE 15 MG/ML IJ SOLN
7.5000 mg | Freq: Four times a day (QID) | INTRAMUSCULAR | Status: AC
  Administered 2023-01-21 (×3): 7.5 mg via INTRAVENOUS
  Filled 2023-01-21 (×3): qty 1

## 2023-01-21 NOTE — Progress Notes (Signed)
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  With increased time, pt up to EOB sitting, to standing and to ambulate limited distance in hall before returning to bed.    Assistance Recommended at Discharge Intermittent Supervision/Assistance  If plan is discharge home, recommend the following:  Can travel by private vehicle    A little help with walking and/or transfers;A little help with bathing/dressing/bathroom;Assistance with cooking/housework;Assist for transportation;Help with stairs or ramp for entrance      Equipment Recommendations  None recommended by PT    Recommendations for Other Services       Precautions / Restrictions Precautions Precautions: Fall Restrictions Weight Bearing Restrictions: No LLE Weight Bearing: Weight bearing as tolerated     Mobility  Bed Mobility Overal bed mobility: Needs Assistance Bed Mobility: Supine to Sit, Sit to Supine     Supine to sit: Min assist Sit to supine: Min assist, Mod assist   General bed mobility comments: Cues for sequence and use of R LE to self assist    Transfers Overall transfer level: Needs assistance Equipment used: Rolling walker (2 wheels) Transfers: Sit to/from Stand, Bed to chair/wheelchair/BSC Sit to Stand: Min assist, From elevated surface           General transfer comment: cues for LE management and use of UEs to self assist.    Ambulation/Gait Ambulation/Gait assistance: Min assist, +2 safety/equipment Gait Distance (Feet): 38 Feet Assistive device: Rolling walker (2 wheels) Gait Pattern/deviations: Step-to pattern, Decreased step length - right, Decreased step length - left, Shuffle, Trunk flexed Gait velocity: decr     General Gait Details: cues for posture and position from RW; distance  pain limited; pt tolerating limited WB on L LE   Stairs             Wheelchair Mobility     Tilt Bed    Modified Rankin (Stroke Patients Only)       Balance Overall balance assessment: Needs assistance Sitting-balance support: No upper extremity supported, Feet supported Sitting balance-Leahy Scale: Fair     Standing balance support: Bilateral upper extremity supported Standing balance-Leahy Scale: Poor                              Cognition Arousal/Alertness: Awake/alert Behavior During Therapy: WFL for tasks assessed/performed Overall Cognitive Status: Within Functional Limits for tasks assessed                                          Exercises Total Joint Exercises Ankle Circles/Pumps: AROM, Both, 15 reps, Supine Quad Sets: AROM, Both, 10 reps, Supine Heel Slides: AAROM, Left, 15 reps, Supine Hip ABduction/ADduction: AAROM, Left, 15 reps, Supine    General Comments        Pertinent Vitals/Pain Pain Assessment Pain Assessment: 0-10 Pain Score: 8  Pain Location: L hip Pain Descriptors / Indicators: Aching, Sore Pain Intervention(s): Limited activity within patient's tolerance, Monitored during session, Premedicated before session, Ice applied    Home Living                          Prior  Function            PT Goals (current goals can now be found in the care plan section) Acute Rehab PT Goals Patient Stated Goal: Regain IND; less pain PT Goal Formulation: With patient Time For Goal Achievement: 02/03/23 Potential to Achieve Goals: Good Progress towards PT goals: Progressing toward goals    Frequency    7X/week      PT Plan Current plan remains appropriate    Co-evaluation              AM-PAC PT "6 Clicks" Mobility   Outcome Measure  Help needed turning from your back to your side while in a flat bed without using bedrails?: A Little Help needed moving from lying on your back to  sitting on the side of a flat bed without using bedrails?: A Little Help needed moving to and from a bed to a chair (including a wheelchair)?: A Little Help needed standing up from a chair using your arms (e.g., wheelchair or bedside chair)?: A Little Help needed to walk in hospital room?: A Little Help needed climbing 3-5 steps with a railing? : Total 6 Click Score: 16    End of Session Equipment Utilized During Treatment: Gait belt Activity Tolerance: Patient limited by pain Patient left: in bed;with call bell/phone within reach;with bed alarm set Nurse Communication: Mobility status PT Visit Diagnosis: Difficulty in walking, not elsewhere classified (R26.2);Pain Pain - Right/Left: Left Pain - part of body: Hip     Time: 1610-9604 PT Time Calculation (min) (ACUTE ONLY): 27 min  Charges:    $Gait Training: 8-22 mins $Therapeutic Exercise: 8-22 mins $Therapeutic Activity: 8-22 mins PT General Charges $$ ACUTE PT VISIT: 1 Visit                     Mauro Kaufmann PT Acute Rehabilitation Services Pager [phone] Office [phone]    [facility] 01/21/2023, 4:46 PM

## 2023-01-21 NOTE — Progress Notes (Signed)
Patient ID: Heidi [name], female   DOB: 09-Jun-1956, 67 y.o.   MRN: [id number] Making slow progress and due to difficulty with pain control.  Her left operative hip is stable.  I did change the dressing and placed a new dressing today due to to scant bloody drainage.  She does have some left knee swelling and she had some of the prior to the surgery.  She will need to be in the hospital until her pain is under better control and her mobility is better.  Will change her to an inpatient admission.  She does take medications/supplements at home for inflammation including turmeric and other supplements.  I am fine with her having those medications here in the hospital if her daughter brings them and they are shown to nursing for recording purposes and administration purposes.

## 2023-01-21 NOTE — Plan of Care (Signed)
  Problem: Coping: Goal: Level of anxiety will decrease Outcome: Progressing   Problem: Pain Managment: Goal: General experience of comfort will improve Outcome: Progressing   

## 2023-01-21 NOTE — Progress Notes (Signed)
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     Assistance Recommended at Discharge Intermittent Supervision/Assistance  If plan is discharge home, recommend the following:  Can travel by private vehicle    A little help with walking and/or transfers;A little help with bathing/dressing/bathroom;Assistance with cooking/housework;Assist for transportation;Help with stairs or ramp for entrance      Equipment Recommendations  None recommended by PT    Recommendations for Other Services       Precautions / Restrictions Precautions Precautions: Fall Restrictions Weight Bearing Restrictions: No LLE Weight Bearing: Weight bearing as tolerated     Mobility  Bed Mobility               General bed mobility comments: OOB deferred to later this date    Transfers                        Ambulation/Gait                   Stairs             Wheelchair Mobility     Tilt Bed    Modified Rankin (Stroke Patients Only)       Balance                                            Cognition Arousal/Alertness: Awake/alert Behavior During Therapy: WFL for tasks assessed/performed Overall Cognitive Status: Within Functional Limits for tasks assessed                                          Exercises Total Joint Exercises Ankle Circles/Pumps: AROM, Both, 15 reps, Supine Quad Sets: AROM, Both, 10 reps, Supine Heel Slides: AAROM, Left, 15 reps, Supine Hip ABduction/ADduction: AAROM, Left, 15 reps, Supine    General Comments        Pertinent Vitals/Pain Pain Assessment Pain Assessment: 0-10 Pain Score: 7  Pain Location: L hip Pain Descriptors /  Indicators: Aching, Sore Pain Intervention(s): Limited activity within patient's tolerance, Monitored during session, Premedicated before session, Ice applied    Home Living                          Prior Function            PT Goals (current goals can now be found in the care plan section) Acute Rehab PT Goals Patient Stated Goal: Regain IND; less pain PT Goal Formulation: With patient Time For Goal Achievement: 02/03/23 Potential to Achieve Goals: Good Progress towards PT goals: Progressing toward goals    Frequency    7X/week      PT Plan Current plan remains appropriate    Co-evaluation              AM-PAC PT "6 Clicks" Mobility   Outcome Measure  Help needed turning from your back to your side while in a flat bed without using bedrails?: A Little  Help needed moving from lying on your back to sitting on the side of a flat bed without using bedrails?: A Little Help needed moving to and from a bed to a chair (including a wheelchair)?: A Little Help needed standing up from a chair using your arms (e.g., wheelchair or bedside chair)?: A Little Help needed to walk in hospital room?: A Lot Help needed climbing 3-5 steps with a railing? : Total 6 Click Score: 15    End of Session Equipment Utilized During Treatment: Gait belt Activity Tolerance: Patient limited by pain Patient left: in bed;with call bell/phone within reach;with bed alarm set Nurse Communication: Mobility status PT Visit Diagnosis: Difficulty in walking, not elsewhere classified (R26.2);Pain Pain - Right/Left: Left Pain - part of body: Hip     Time: 1030-1051 PT Time Calculation (min) (ACUTE ONLY): 21 min  Charges:    $Therapeutic Exercise: 8-22 mins PT General Charges $$ ACUTE PT VISIT: 1 Visit                     Mauro Kaufmann PT Acute Rehabilitation Services Pager [phone] Office [phone]    Heidi [name] 01/21/2023, 2:26 PM

## 2023-01-22 ENCOUNTER — Encounter (HOSPITAL_COMMUNITY): Payer: Self-pay | Admitting: Orthopaedic Surgery

## 2023-01-22 NOTE — Progress Notes (Signed)
Patient ID: Heidi [name], female   DOB: 08/27/1955, 67 y.o.   MRN: [id number] The patient is awake and alert.  Pain has been a limiting factor that is slow with her mobility.  She is working with PT but again has had slow progress.  Her left operative hip is otherwise stable.  There is been no acute change in medical status.  Even her last hospitalization I believe she stayed 5 days.  Hopefully she will make improvements in pain control today with anticipation of discharge in the next 1 to 2 days.

## 2023-01-22 NOTE — Progress Notes (Signed)
Spoke to [employer], [place] about Pt's pain level- still at a 9 after  1mg  dilaudid, so he verbally gave order to give oxycodone as buffer since it has been over 4 hrs since she received it even though she is outside of scale, as she has history of difficult pain management.   He verbally ordered a kpad for her shoulder pain.

## 2023-01-22 NOTE — Plan of Care (Signed)
  Problem: Education: Goal: Understanding of discharge needs will improve Outcome: Progressing   Problem: Pain Management: Goal: Pain level will decrease with appropriate interventions Outcome: Not Progressing

## 2023-01-22 NOTE — Progress Notes (Signed)
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  General bed mobility comments: Cues for sequence and use of R LE to self assist.   General Gait Details: cues for posture and position from RW; distance pain limited; pt tolerating limited WB on L LE  Grimacing in pain.  Difficulty advancing L LE.Marland Kitchen Requires increased asisst back to bed to support B LE due to increased pain.  Pt requesting IV pain meds.  RN reported.   Pt having difficulty with pain control.    Assistance Recommended at Discharge Intermittent Supervision/Assistance  If plan is discharge home, recommend the following:  Can travel by private vehicle    A little help with walking and/or transfers;A little help with bathing/dressing/bathroom;Assistance with cooking/housework;Assist for transportation;Help with stairs or ramp for entrance      Equipment Recommendations       Recommendations for Other Services       Precautions / Restrictions Precautions Precautions: Fall Restrictions Weight Bearing Restrictions: No LLE Weight Bearing: Weight bearing as tolerated     Mobility  Bed Mobility Overal bed mobility: Needs Assistance Bed Mobility: Supine to Sit, Sit to Supine     Supine to sit: Min assist Sit to supine: Max assist   General bed mobility comments: Cues for sequence and use of R LE to self assist.  Requires increased asisst back to bed to support B LE due to increased pain.    Transfers Overall transfer level: Needs assistance Equipment used: Rolling walker (2 wheels) Transfers: Sit to/from Stand, Bed to chair/wheelchair/BSC Sit to Stand: Min assist, From elevated surface           General transfer comment: cues for LE management and use of UEs to self assist wih  increased time.  Also asissted with BSC transfer.    Ambulation/Gait Ambulation/Gait assistance: Min assist Gait Distance (Feet): 45 Feet Assistive device: Rolling walker (2 wheels) Gait Pattern/deviations: Step-to pattern, Decreased step length - right, Decreased step length - left, Shuffle, Trunk flexed Gait velocity: decr     General Gait Details: cues for posture and position from RW; distance pain limited; pt tolerating limited WB on L LE  Grimacing in pain.  Difficulty advancing L LE.   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  "This one is way worse".  Pt reports high pain numbers with "little" relief from meds.        Exercises      General Comments  Pertinent Vitals/Pain Pain Assessment Pain Assessment: 0-10 Pain Score: 10-Worst pain ever Pain Location: L hip Pain Descriptors / Indicators: Aching, Sore Pain Intervention(s): Monitored during session, Premedicated before session, Repositioned, Ice applied    Home Living                          Prior Function            PT Goals (current goals can now be found in the care plan section) Progress towards PT goals: Progressing toward goals    Frequency    7X/week      PT Plan Current plan remains appropriate    Co-evaluation              AM-PAC PT "6 Clicks" Mobility   Outcome Measure  Help needed turning from your back to your side while in a flat bed without using bedrails?: A Little Help needed moving from lying on your back to sitting on the side of  a flat bed without using bedrails?: A Little Help needed moving to and from a bed to a chair (including a wheelchair)?: A Little Help needed standing up from a chair using your arms (e.g., wheelchair or bedside chair)?: A Little Help needed to walk in hospital room?: A Little Help needed climbing 3-5 steps with a railing? : A Little 6 Click Score: 18    End of Session Equipment Utilized During Treatment: Gait belt Activity Tolerance: Patient limited by pain Patient left: in bed;with call bell/phone within reach;with bed alarm set Nurse Communication: Mobility status PT Visit Diagnosis: Difficulty in walking, not elsewhere classified (R26.2);Pain Pain - Right/Left: Left Pain - part of body: Hip     Time: 2440-1027 PT Time Calculation (min) (ACUTE ONLY): 33 min  Charges:    $Gait Training: 8-22 mins $Therapeutic Activity: 8-22 mins PT General Charges $$ ACUTE PT VISIT: 1 Visit                     Felecia Shelling  PTA Acute  Rehabilitation Services Office M-F          [phone]

## 2023-01-22 NOTE — Progress Notes (Signed)
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  "This one is way worse".  Pt reports high pain numbers with "little" relief from meds. Assisted OOB was difficult.  General bed mobility comments: Cues for sequence and use of R LE to self assist.  General transfer comment: cues for LE management and use of UEs to self assist wih increased time.  Also asissted with BSC transfer. General Gait Details: cues for posture and position from RW; distance pain limited; pt tolerating limited WB on L LE  Grimacing in pain.  Difficulty advancing L LE.Requires increased asisst back to bed to support B LE due to increased pain. Pt plans to return home.  Her Daughter is in town from [place] till Friday, July 19th     Assistance Recommended at Discharge Intermittent Supervision/Assistance  If plan is discharge home, recommend the following:  Can travel by private vehicle    A little help with walking and/or transfers;A little help with bathing/dressing/bathroom;Assistance with cooking/housework;Assist for transportation;Help with stairs or ramp for entrance      Equipment Recommendations       Recommendations for Other Services       Precautions / Restrictions Precautions Precautions: Fall Restrictions Weight Bearing Restrictions: No LLE Weight Bearing: Weight bearing as tolerated     Mobility  Bed Mobility Overal bed mobility: Needs Assistance Bed Mobility: Supine to Sit, Sit to Supine     Supine to sit: Min assist Sit to supine: Max assist   General bed mobility comments: Cues for sequence and use of R LE to self assist.  Requires increased asisst back to bed to support B LE due to increased pain.    Transfers Overall transfer level: Needs assistance Equipment  used: Rolling walker (2 wheels) Transfers: Sit to/from Stand, Bed to chair/wheelchair/BSC Sit to Stand: Min assist, From elevated surface           General transfer comment: cues for LE management and use of UEs to self assist wih increased time.  Also asissted with BSC transfer.    Ambulation/Gait Ambulation/Gait assistance: Min assist Gait Distance (Feet): 40 Feet Assistive device: Rolling walker (2 wheels) Gait Pattern/deviations: Step-to pattern, Decreased step length - right, Decreased step length - left, Shuffle, Trunk flexed Gait velocity: decr     General Gait Details: cues for posture and position from RW; distance pain limited; pt tolerating limited WB on L LE  Grimacing in pain.  Difficulty advancing L LE.   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  "This one is way worse".  Pt reports high pain numbers with "little" relief from meds.        Exercises      General Comments        Pertinent Vitals/Pain Pain Assessment Pain Assessment: 0-10 Pain Score: 10-Worst pain ever Pain Location: L hip Pain Descriptors / Indicators: Aching, Sore Pain Intervention(s): Monitored during session, Premedicated before session, Repositioned, Ice applied    Home Living                          Prior Function            PT Goals (current goals can now be found in the care plan section) Progress towards PT goals: Progressing toward goals    Frequency    7X/week      PT Plan Current plan remains appropriate    Co-evaluation               AM-PAC PT "6 Clicks" Mobility   Outcome Measure  Help needed turning from your back to your side while in a flat bed without using bedrails?: A Little Help needed moving from lying on your back to sitting on the side of a flat bed without using bedrails?: A Little Help needed moving to and from a bed to a chair (including a wheelchair)?: A Little Help needed standing up from a chair using your arms (e.g., wheelchair or bedside chair)?: A Little Help needed to walk in hospital room?: A Little Help needed climbing 3-5 steps with a railing? : A Little 6 Click Score: 18    End of Session Equipment Utilized During Treatment: Gait belt Activity Tolerance: Patient limited by pain Patient left: in bed;with call bell/phone within reach;with bed alarm set Nurse Communication: Mobility status PT Visit Diagnosis: Difficulty in walking, not elsewhere classified (R26.2);Pain Pain - Right/Left: Left Pain - part of body: Hip     Time: 1130-1200 PT Time Calculation (min) (ACUTE ONLY): 30 min  Charges:    $Gait Training: 8-22 mins $Therapeutic Activity: 8-22 mins PT General Charges $$ ACUTE PT VISIT: 1 Visit                     Felecia Shelling  PTA Acute  Rehabilitation Services Office M-F          [phone]

## 2023-01-23 NOTE — Progress Notes (Signed)
Physical Therapy Treatment Patient Details Name: Karliah Kowalchuk MRN: [id number] DOB: 1955/09/27 Today's Date: 01/23/2023   History of Present Illness Pt s/p L THR and with hx of R THR    PT Comments  POD # 4 General Comments: AxO x 3 pleasant but having issues of pain control and slow mobility/progress Assisted OOB required increased time.  General transfer comment: cues for LE management and use of UEs to self assist wih increased time.  Also asissted with BSC transfer. General Gait Details: cues for posture and position from RW; distance pain limited; pt tolerating limited WB on L LE  Grimacing in pain.  Difficulty advancing L LE. Assisted back to bed per pt request and positioned to comfort.       Assistance Recommended at Discharge    If plan is discharge home, recommend the following:  Can travel by private vehicle    A little help with walking and/or transfers;A little help with bathing/dressing/bathroom;Assistance with cooking/housework;Assist for transportation;Help with stairs or ramp for entrance      Equipment Recommendations  None recommended by PT    Recommendations for Other Services       Precautions / Restrictions Precautions Precautions: Fall Restrictions Weight Bearing Restrictions: No LLE Weight Bearing: Weight bearing as tolerated     Mobility  Bed Mobility Overal bed mobility: Needs Assistance Bed Mobility: Supine to Sit, Sit to Supine     Supine to sit: Min assist Sit to supine: Max assist   General bed mobility comments: Cues for sequence and use of R LE to self assist.  Requires increased asisst back to bed to support B LE due to increased pain.    Transfers Overall transfer level: Needs assistance Equipment used: Rolling walker (2 wheels) Transfers: Sit to/from Stand, Bed to chair/wheelchair/BSC Sit to Stand: Min assist, From elevated surface   Step pivot transfers: Min assist       General transfer comment: cues for LE management and use  of UEs to self assist wih increased time.  Also asissted with BSC transfer.    Ambulation/Gait Ambulation/Gait assistance: Supervision, Min guard Gait Distance (Feet): 32 Feet Assistive device: Rolling walker (2 wheels) Gait Pattern/deviations: Step-to pattern, Decreased step length - right, Decreased step length - left, Shuffle, Trunk flexed Gait velocity: decr     General Gait Details: cues for posture and position from RW; distance pain limited; pt tolerating limited WB on L LE  Grimacing in pain.  Difficulty advancing L LE.   Stairs             Wheelchair Mobility     Tilt Bed    Modified Rankin (Stroke Patients Only)       Balance                                            Cognition Arousal/Alertness: Awake/alert Behavior During Therapy: WFL for tasks assessed/performed Overall Cognitive Status: Within Functional Limits for tasks assessed                                 General Comments: AxO x 3 pleasant but having issues of pain control and slow mobility/progress        Exercises      General Comments        Pertinent Vitals/Pain Pain Assessment Pain Assessment:  0-10 Pain Score: 10-Worst pain ever Pain Location: L hip Pain Descriptors / Indicators: Aching, Sore Pain Intervention(s): Monitored during session, Premedicated before session, Repositioned, Ice applied    Home Living                          Prior Function            PT Goals (current goals can now be found in the care plan section) Progress towards PT goals: Progressing toward goals    Frequency    7X/week      PT Plan Current plan remains appropriate    Co-evaluation              AM-PAC PT "6 Clicks" Mobility   Outcome Measure  Help needed turning from your back to your side while in a flat bed without using bedrails?: A Little Help needed moving from lying on your back to sitting on the side of a flat bed without  using bedrails?: A Little Help needed moving to and from a bed to a chair (including a wheelchair)?: A Little Help needed standing up from a chair using your arms (e.g., wheelchair or bedside chair)?: A Little Help needed to walk in hospital room?: A Little Help needed climbing 3-5 steps with a railing? : A Little 6 Click Score: 18    End of Session Equipment Utilized During Treatment: Gait belt Activity Tolerance: Patient limited by pain Patient left: in bed;with call bell/phone within reach;with bed alarm set Nurse Communication: Mobility status PT Visit Diagnosis: Difficulty in walking, not elsewhere classified (R26.2);Pain Pain - Right/Left: Left Pain - part of body: Hip     Time: 1209-1236 PT Time Calculation (min) (ACUTE ONLY): 27 min  Charges:    $Gait Training: 8-22 mins $Therapeutic Activity: 8-22 mins PT General Charges $$ ACUTE PT VISIT: 1 Visit                     Felecia Shelling  PTA Acute  Rehabilitation Services Office M-F          [phone]

## 2023-01-23 NOTE — Plan of Care (Signed)
  Problem: Education: Goal: Knowledge of the prescribed therapeutic regimen will improve Outcome: Adequate for Discharge Goal: Understanding of discharge needs will improve Outcome: Progressing Goal: Individualized Educational Video(s) Outcome: Completed/Met   Problem: Activity: Goal: Ability to avoid complications of mobility impairment will improve Outcome: Adequate for Discharge Goal: Ability to tolerate increased activity will improve Outcome: Progressing   Problem: Clinical Measurements: Goal: Postoperative complications will be avoided or minimized Outcome: Progressing   Problem: Pain Management: Goal: Pain level will decrease with appropriate interventions Outcome: Progressing   Problem: Skin Integrity: Goal: Will show signs of wound healing Outcome: Progressing   Problem: Education: Goal: Knowledge of General Education information will improve Description: Including pain rating scale, medication(s)/side effects and non-pharmacologic comfort measures Outcome: Adequate for Discharge   Problem: Health Behavior/Discharge Planning: Goal: Ability to manage health-related needs will improve Outcome: Adequate for Discharge   Problem: Clinical Measurements: Goal: Ability to maintain clinical measurements within normal limits will improve Outcome: Progressing Goal: Will remain free from infection Outcome: Progressing Goal: Diagnostic test results will improve Outcome: Progressing Goal: Respiratory complications will improve Outcome: Adequate for Discharge Goal: Cardiovascular complication will be avoided Outcome: Progressing   Problem: Activity: Goal: Risk for activity intolerance will decrease Outcome: Adequate for Discharge   Problem: Nutrition: Goal: Adequate nutrition will be maintained Outcome: Adequate for Discharge   Problem: Coping: Goal: Level of anxiety will decrease Outcome: Progressing   Problem: Elimination: Goal: Will not experience  complications related to bowel motility Outcome: Progressing Goal: Will not experience complications related to urinary retention Outcome: Completed/Met   Problem: Pain Managment: Goal: General experience of comfort will improve Outcome: Progressing   Problem: Safety: Goal: Ability to remain free from injury will improve Outcome: Progressing   Problem: Skin Integrity: Goal: Risk for impaired skin integrity will decrease Outcome: Progressing

## 2023-01-23 NOTE — Progress Notes (Signed)
Physical Therapy Treatment Patient Details Name: Heidi [name] MRN: [id number] DOB: 1956/05/29 Today's Date: 01/23/2023   History of Present Illness Pt s/p L THR and with hx of R THR    PT Comments  POD # 4 pm session Performed THR TE's 75% AAROM due to pain Pt is expected to D/C to home tomorrow.   Will need to practice ONE step she has to enter her home.     Assistance Recommended at Discharge    If plan is discharge home, recommend the following:  Can travel by private vehicle    A little help with walking and/or transfers;A little help with bathing/dressing/bathroom;Assistance with cooking/housework;Assist for transportation;Help with stairs or ramp for entrance      Equipment Recommendations  None recommended by PT    Recommendations for Other Services       Precautions / Restrictions Precautions Precautions: Fall Restrictions Weight Bearing Restrictions: No LLE Weight Bearing: Weight bearing as tolerated        Balance                                            Cognition Arousal/Alertness: Awake/alert Behavior During Therapy: WFL for tasks assessed/performed Overall Cognitive Status: Within Functional Limits for tasks assessed                                 General Comments: AxO x 3 pleasant but having issues of pain control and slow mobility/progress        Exercises  Total Hip Replacement TE's following HEP Handout 10 reps ankle pumps 05 reps knee presses 05 reps heel slides 05 reps SAQ's 05 reps ABD Instructed how to use a belt loop to assist  Followed by ICE     General Comments        Pertinent Vitals/Pain Pain Assessment Pain Assessment: 0-10 Pain Score: 10-Worst pain ever Pain Location: L hip Pain Descriptors / Indicators: Aching, Sore Pain Intervention(s): Monitored during session, Premedicated before session, Repositioned, Ice applied    Home Living                          Prior  Function            PT Goals (current goals can now be found in the care plan section) Progress towards PT goals: Progressing toward goals    Frequency    7X/week      PT Plan Current plan remains appropriate    Co-evaluation              AM-PAC PT "6 Clicks" Mobility   Outcome Measure  Help needed turning from your back to your side while in a flat bed without using bedrails?: A Little Help needed moving from lying on your back to sitting on the side of a flat bed without using bedrails?: A Little Help needed moving to and from a bed to a chair (including a wheelchair)?: A Little Help needed standing up from a chair using your arms (e.g., wheelchair or bedside chair)?: A Little Help needed to walk in hospital room?: A Little Help needed climbing 3-5 steps with a railing? : A Little 6 Click Score: 18    End of Session Equipment Utilized During Treatment: Gait belt Activity Tolerance: Patient limited by pain  Patient left: in bed;with call bell/phone within reach;with bed alarm set Nurse Communication: Mobility status PT Visit Diagnosis: Difficulty in walking, not elsewhere classified (R26.2);Pain Pain - Right/Left: Left Pain - part of body: Hip     Time: 0454-0981 PT Time Calculation (min) (ACUTE ONLY): 16 min  Charges:     $Therapeutic Exercise: 8-22 mins PT General Charges $$ ACUTE PT VISIT: 1 Visit                     Felecia Shelling  PTA Acute  Rehabilitation Services Office M-F          [phone]

## 2023-01-23 NOTE — Progress Notes (Signed)
Patient ID: Heidi [name], female   DOB: 1956/03/06, 67 y.o.   MRN: [id number] There has been no acute change in the patient's medical status.  Her left operative hip is stable.  Her mobility has been limited by pain.  The last time she was in the hospital for her right hip she states 5 days.  Hopefully we will be able to have her discharged home by tomorrow.

## 2023-01-24 MED ORDER — TIZANIDINE HCL 4 MG PO TABS: 4.0000 mg | ORAL_TABLET | Freq: Four times a day (QID) | ORAL | 0 refills | Status: DC | PRN

## 2023-01-24 MED ORDER — ONDANSETRON 4 MG PO TBDP: 4.0000 mg | ORAL_TABLET | Freq: Three times a day (TID) | ORAL | 0 refills | Status: DC | PRN

## 2023-01-24 MED ORDER — HYDROMORPHONE HCL 4 MG PO TABS: 4.0000 mg | ORAL_TABLET | Freq: Four times a day (QID) | ORAL | 0 refills | Status: DC | PRN

## 2023-01-24 MED ORDER — ASPIRIN 81 MG PO CHEW: 81.0000 mg | CHEWABLE_TABLET | Freq: Two times a day (BID) | ORAL | 0 refills | Status: AC

## 2023-01-24 NOTE — Plan of Care (Signed)
  Problem: Education: Goal: Knowledge of the prescribed therapeutic regimen will improve Outcome: Adequate for Discharge Goal: Understanding of discharge needs will improve Outcome: Progressing   Problem: Activity: Goal: Ability to avoid complications of mobility impairment will improve Outcome: Adequate for Discharge Goal: Ability to tolerate increased activity will improve Outcome: Adequate for Discharge   Problem: Clinical Measurements: Goal: Postoperative complications will be avoided or minimized Outcome: Progressing   Problem: Pain Management: Goal: Pain level will decrease with appropriate interventions Outcome: Progressing   Problem: Skin Integrity: Goal: Will show signs of wound healing Outcome: Adequate for Discharge   Problem: Education: Goal: Knowledge of General Education information will improve Description: Including pain rating scale, medication(s)/side effects and non-pharmacologic comfort measures Outcome: Adequate for Discharge   Problem: Health Behavior/Discharge Planning: Goal: Ability to manage health-related needs will improve Outcome: Progressing   Problem: Clinical Measurements: Goal: Ability to maintain clinical measurements within normal limits will improve Outcome: Adequate for Discharge Goal: Will remain free from infection Outcome: Progressing Goal: Diagnostic test results will improve Outcome: Adequate for Discharge Goal: Respiratory complications will improve Outcome: Adequate for Discharge Goal: Cardiovascular complication will be avoided Outcome: Adequate for Discharge   Problem: Activity: Goal: Risk for activity intolerance will decrease Outcome: Adequate for Discharge   Problem: Nutrition: Goal: Adequate nutrition will be maintained Outcome: Adequate for Discharge   Problem: Coping: Goal: Level of anxiety will decrease Outcome: Progressing   Problem: Elimination: Goal: Will not experience complications related to bowel  motility Outcome: Progressing   Problem: Pain Managment: Goal: General experience of comfort will improve Outcome: Progressing   Problem: Safety: Goal: Ability to remain free from injury will improve Outcome: Adequate for Discharge   Problem: Skin Integrity: Goal: Risk for impaired skin integrity will decrease Outcome: Adequate for Discharge

## 2023-01-24 NOTE — Progress Notes (Signed)
At 0700 RN was notified that patient was calling out for pain medications, was crying, moaning and grabbing her leg. RN withdrew IV dilaudid and went to examine patient. Found patient sleeping, did not rouse when RN knocked on door. Noted MD was preparing to enter room, spoke w/ Dr Magnus Ivan about pain and rec'd VO that IV dilaudid was discontinued and she should only receive PO medications until d/c. Dr Magnus Ivan explained to patient and RN  Discussed w/ patient during BSR pain control schedule per MD orders. Wrote times that each medication was available on white board. Explained to patient that MD had discontinued IV narcotics and were no longer available. Encouraged patient to order breakfast and try to eat some protein and carbs to assist with healing and energy. Patient verbalized understanding. Patient continued to call out for pain medications and was reminded of next available times.  Patient rec'd po dilaudid at 0830 and was informed oxycodone would be available at 1010. Patient called out several times and was again reminded about times but patient stated that she had not rec'd the dilaudid at 0830. RN spoke with patient, reassuring her that Amy Abernathy RN had charted that she administered the dilaudid to the patient and in order to do so would have had to have scanned the patient's armband, verify her ID and scanned the medication. Patient states that "no one has been in my room in 3 hours because that's how long I've been asking for pain medication." RN reminded patient that staff has been rounding and reassured her that she did receive dilaudid at 0830. Patient remained adamant she had not. RN asked Amy Abernathy RN to come to room and Amy spoke with patient reassuring her that she had witnessed patient taking medications and she must have forgotten. Patient stated at that time "Fine, but my food is cold because it's been sitting here because you told me to eat when I took my pain medications so it  wouldn't upset my stomach." RN continued to educate and alleviate concerns and provide solutions for complaints.  Patient eating breakfast, RN wrote times on whiteboards for medications. Will cont to monitor.

## 2023-01-24 NOTE — Progress Notes (Signed)
Discussed BM management w/ patient, per patient report last BM 7/12. Patient reports appetite unchanged and is passing flatus. Informed patient RN would like to notify MD for laxative or suppository. Patient declined, states she has medications at home she would prefer to use when she gets home and it's not unusual for her to go a few days between Bms. Educated need for patient to have BM within next day or two and to monitor for nausea, distention, bloating, loss of flatus. Patient verbalized understanding.

## 2023-01-24 NOTE — Discharge Summary (Signed)
Patient ID: Heidi [name] MRN: [id number] DOB/AGE: 66-May-1957 66 y.o.  Admit date: 01/19/2023 Discharge date: 01/24/2023  Admission Diagnoses:  Principal Problem:   Avascular necrosis of bone of left hip (HCC) Active Problems:   Status post total replacement of left hip   Discharge Diagnoses:  Same  Past Medical History:  Diagnosis Date   Arthritis    Asthma    GERD (gastroesophageal reflux disease)     Surgeries: Procedure(s): LEFT TOTAL HIP ARTHROPLASTY ANTERIOR APPROACH on 01/19/2023   Consultants:   Discharged Condition: Improved  Hospital Course: Heidi [name] is an 67 y.o. female who was admitted 01/19/2023 for operative treatment ofAvascular necrosis of bone of left hip (HCC). Patient has severe unremitting pain that affects sleep, daily activities, and work/hobbies. After pre-op clearance the patient was taken to the operating room on 01/19/2023 and underwent  Procedure(s): LEFT TOTAL HIP ARTHROPLASTY ANTERIOR APPROACH.    Patient was given perioperative antibiotics:  Anti-infectives (From admission, onward)    Start     Dose/Rate Route Frequency Ordered Stop   01/19/23 1500  ceFAZolin (ANCEF) IVPB 1 g/50 mL premix        1 g 100 mL/hr over 30 Minutes Intravenous Every 6 hours 01/19/23 1118 01/19/23 2120   01/19/23 0600  ceFAZolin (ANCEF) IVPB 2g/100 mL premix        2 g 200 mL/hr over 30 Minutes Intravenous On call to O.R. 01/19/23 0540 01/19/23 0720        Patient was given sequential compression devices, early ambulation, and chemoprophylaxis to prevent DVT.  Patient benefited maximally from hospital stay and there were no complications.    Recent vital signs: Patient Vitals for the past 24 hrs:  BP Temp Temp src Pulse Resp SpO2  01/24/23 0515 120/77 98.6 F (37 C) Oral 96 16 92 %  01/23/23 2149 (!) 144/73 (!) 100.4 F (38 C) Oral -- 18 93 %  01/23/23 1310 (!) 101/58 98.5 F (36.9 C) -- 76 17 92 %     Recent laboratory studies: No results for input(s):  "WBC", "HGB", "HCT", "PLT", "NA", "K", "CL", "CO2", "BUN", "CREATININE", "GLUCOSE", "INR", "CALCIUM" in the last 72 hours.  Invalid input(s): "PT", "2"   Discharge Medications:   Allergies as of 01/24/2023       Reactions   Hydrocodone Other (See Comments)   Reaction: Tremor (intolerance); shakey Keep her up   Codeine    "Makes me really sick."    Erythromycin    "Makes me really sick."         Medication List     STOP taking these medications    naproxen sodium 220 MG tablet Commonly known as: ALEVE       TAKE these medications    aspirin 81 MG chewable tablet Chew 1 tablet (81 mg total) by mouth 2 (two) times daily.   HYDROmorphone 4 MG tablet Commonly known as: Dilaudid Take 1 tablet (4 mg total) by mouth every 6 (six) hours as needed for severe pain.   loperamide 2 MG tablet Commonly known as: IMODIUM A-D Take 2 mg by mouth 4 (four) times daily as needed for diarrhea or loose stools.   loratadine 10 MG tablet Commonly known as: CLARITIN Take 10 mg by mouth daily.   meloxicam 15 MG tablet Commonly known as: Mobic Take 1 tablet (15 mg total) by mouth daily.   omeprazole 20 MG capsule Commonly known as: PRILOSEC Take 20 mg by mouth as needed.   ondansetron 4 MG  disintegrating tablet Commonly known as: ZOFRAN-ODT Take 1 tablet (4 mg total) by mouth every 8 (eight) hours as needed for nausea or vomiting.   OVER THE COUNTER MEDICATION Take 3-4 capsules by mouth at bedtime. CBD 100 mg   tiZANidine 4 MG tablet Commonly known as: ZANAFLEX Take 1 tablet (4 mg total) by mouth every 6 (six) hours as needed for muscle spasms. What changed: See the new instructions.   Voltaren Arthritis Pain 1 % Gel Generic drug: diclofenac Sodium Apply 2 g topically daily as needed (pain).               Durable Medical Equipment  (From admission, onward)           Start     Ordered   01/19/23 1108  DME 3 n 1  Once        01/19/23 1107   01/19/23 1108   DME Walker rolling  Once       Question Answer Comment  Walker: With 5 Inch Wheels   Patient needs a walker to treat with the following condition Status post total replacement of left hip      01/19/23 1107            Diagnostic Studies: DG Pelvis Portable  Result Date: 01/19/2023 CLINICAL DATA:  Status post left hip replacement. EXAM: PORTABLE PELVIS 1-2 VIEWS COMPARISON:  None Available. FINDINGS: Left hip arthroplasty in expected alignment. No periprosthetic lucency or fracture. Recent postsurgical change includes air and edema in the soft tissues. Lateral skin staples in place. Prior right hip arthroplasty. IMPRESSION: Left hip arthroplasty without immediate postoperative complication. Electronically Signed   By: Narda Rutherford M.D.   On: 01/19/2023 10:28   DG HIP UNILAT WITH PELVIS 1V LEFT  Result Date: 01/19/2023 CLINICAL DATA:  Left hip surgery EXAM: DG HIP (WITH OR WITHOUT PELVIS) 1V*L* COMPARISON:  None available FINDINGS: Three frontal intraoperative fluoroscopic images of the left hip were provided for interpretation. The submitted images demonstrate a well seated left total hip prosthesis. Right total hip prosthesis partially visualized. IMPRESSION: Intraoperative fluoroscopic images demonstrating left total hip replacement. Electronically Signed   By: Acquanetta Belling M.D.   On: 01/19/2023 08:29   DG C-Arm 1-60 Min-No Report  Result Date: 01/19/2023 Fluoroscopy was utilized by the requesting physician.  No radiographic interpretation.    Disposition: Discharge disposition: 01-Home or Self Care          Follow-up Information     [facility] Follow up.   Specialty: Home Health Services Why: to provide home physical therapy visits Contact information: [address] [place] [address] [phone]         Kathryne Hitch, MD Follow up in 2 week(s).   Specialty: Orthopedic Surgery Contact information: [address] [place]  [address] [phone]                  Signed: Kathryne Hitch 01/24/2023, 12:50 PM

## 2023-01-24 NOTE — Progress Notes (Signed)
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  Will have a Daughter from [facility] staying with her. Assisted OOB to amb in hallway and practice ONE step she has to enter her home. Addressed all mobility questions, discussed appropriate activity, educated on use of ICE.  Pt ready for D/C to home.     Assistance Recommended at Discharge Intermittent Supervision/Assistance  If plan is discharge home, recommend the following:  Can travel by private vehicle    A little help with walking and/or transfers;A little help with bathing/dressing/bathroom;Assistance with cooking/housework;Assist for transportation;Help with stairs or ramp for entrance      Equipment Recommendations  None recommended by PT    Recommendations for Other Services       Precautions / Restrictions Precautions Precautions: Fall Restrictions Weight Bearing Restrictions: No LLE Weight Bearing: Weight bearing as tolerated     Mobility  Bed Mobility Overal bed mobility: Needs Assistance Bed Mobility: Supine to Sit, Sit to Supine     Supine to sit: Supervision, Min guard Sit to supine: Supervision, Min guard   General bed mobility comments: Cues for sequence and use of R LE to self assis as well as belt.  Increase time.    Transfers Overall transfer level: Needs assistance Equipment used: Rolling walker (2 wheels) Transfers: Sit to/from Stand Sit to Stand: Supervision, Min guard           General transfer comment: cues for LE management and use of UEs to self assist wih increased time.  Self able.    Ambulation/Gait Ambulation/Gait assistance: Supervision Gait Distance (Feet): 22 Feet Assistive device: Rolling walker (2 wheels) Gait  Pattern/deviations: Step-to pattern, Decreased step length - right, Decreased step length - left, Shuffle, Trunk flexed Gait velocity: decr     General Gait Details: tolerated a functional distance at Supervision level   Stairs Stairs: Yes Stairs assistance: Supervision, Min guard Stair Management: No rails, Step to pattern, Forwards Number of Stairs: 1 General stair comments: VC's on proper walker placement as well as sequencing.  Performed well.   Wheelchair Mobility     Tilt Bed    Modified Rankin (Stroke Patients Only)       Balance                                            Cognition Arousal/Alertness: Awake/alert Behavior During Therapy: WFL for tasks assessed/performed Overall Cognitive Status: Within Functional Limits for tasks assessed                                 General Comments: AxO x 3 pleasant but having issues of pain control and slow mobility/progress.  Will have a Daughter from [facility] staying with her.        Exercises      General Comments        Pertinent Vitals/Pain Pain Assessment Pain Assessment: 0-10 Pain Score: 9  Pain Location: L hip Pain Descriptors / Indicators: Aching, Sore, Discomfort, Grimacing Pain Intervention(s): Monitored during session, Premedicated before session, Repositioned    Home  Living                          Prior Function            PT Goals (current goals can now be found in the care plan section) Progress towards PT goals: Progressing toward goals    Frequency    7X/week      PT Plan Current plan remains appropriate    Co-evaluation              AM-PAC PT "6 Clicks" Mobility   Outcome Measure  Help needed turning from your back to your side while in a flat bed without using bedrails?: None Help needed moving from lying on your back to sitting on the side of a flat bed without using bedrails?: None Help needed moving to and from a bed to a chair  (including a wheelchair)?: None Help needed standing up from a chair using your arms (e.g., wheelchair or bedside chair)?: None Help needed to walk in hospital room?: None Help needed climbing 3-5 steps with a railing? : A Little 6 Click Score: 23    End of Session Equipment Utilized During Treatment: Gait belt Activity Tolerance: Patient tolerated treatment well Patient left: in bed;with call bell/phone within reach;with bed alarm set Nurse Communication: Mobility status (pt ready for D/C to home.) PT Visit Diagnosis: Difficulty in walking, not elsewhere classified (R26.2);Pain Pain - Right/Left: Left Pain - part of body: Hip     Time: 1130-1156 PT Time Calculation (min) (ACUTE ONLY): 26 min  Charges:    $Gait Training: 8-22 mins $Therapeutic Activity: 8-22 mins PT General Charges $$ ACUTE PT VISIT: 1 Visit                     Felecia Shelling  PTA Acute  Rehabilitation Services Office M-F          [phone]

## 2023-01-24 NOTE — Progress Notes (Signed)
Patient discharged home w/ family. Given all belongings, instructions, equipment. Verbalized understanding of all instructions. Stressed importance of BM management at home. Escorted to pov via w/c.

## 2023-01-24 NOTE — Progress Notes (Signed)
Patient ID: Heidi [name], female   DOB: 1956/01/05, 67 y.o.   MRN: [id number] No acute changes.  Vitals stable and left hip stable.  Needs to be discharged to home today.  Acute hospitalization no longer needed.

## 2023-01-24 NOTE — Care Management Important Message (Signed)
Important Message  Patient Details  Name: Heidi [name] MRN: [id number] Date of Birth: 11/23/1955   Medicare Important Message Given:  Yes     Mardene Sayer 01/24/2023, 12:21 PM

## 2023-01-30 ENCOUNTER — Telehealth: Payer: Self-pay | Admitting: Orthopaedic Surgery

## 2023-01-30 ENCOUNTER — Other Ambulatory Visit: Payer: Self-pay | Admitting: Orthopaedic Surgery

## 2023-01-30 MED ORDER — HYDROMORPHONE HCL 4 MG PO TABS: 4.0000 mg | ORAL_TABLET | Freq: Four times a day (QID) | ORAL | 0 refills | Status: DC | PRN

## 2023-01-30 MED ORDER — ONDANSETRON 4 MG PO TBDP: 4.0000 mg | ORAL_TABLET | Freq: Three times a day (TID) | ORAL | 0 refills | Status: DC | PRN

## 2023-01-30 NOTE — Telephone Encounter (Signed)
Patient called needing Rx refilled (only a weeks worth) Hydromorphone and Ondansetron. The number to contact patient is [phone]

## 2023-01-30 NOTE — Telephone Encounter (Signed)
Patient called. Would like to know if she can take a shower? Her cb# [phone]

## 2023-01-31 NOTE — Telephone Encounter (Signed)
Lvm advising to keep her bandage on while showering to keep incision dry

## 2023-02-07 ENCOUNTER — Ambulatory Visit: Payer: PPO | Admitting: Orthopaedic Surgery

## 2023-02-07 ENCOUNTER — Encounter: Payer: Self-pay | Admitting: Orthopaedic Surgery

## 2023-02-07 DIAGNOSIS — Z96642 Presence of left artificial hip joint: Secondary | ICD-10-CM

## 2023-02-07 MED ORDER — HYDROMORPHONE HCL 4 MG PO TABS: 4.0000 mg | ORAL_TABLET | Freq: Four times a day (QID) | ORAL | 0 refills | Status: DC | PRN

## 2023-02-07 MED ORDER — TIZANIDINE HCL 4 MG PO TABS: 4.0000 mg | ORAL_TABLET | Freq: Four times a day (QID) | ORAL | 0 refills | Status: DC | PRN

## 2023-02-07 MED ORDER — ONDANSETRON 4 MG PO TBDP: 4.0000 mg | ORAL_TABLET | Freq: Three times a day (TID) | ORAL | 0 refills | Status: DC | PRN

## 2023-02-07 NOTE — Progress Notes (Signed)
HPI: 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  She states she is still having significant pain in the hip ranks her pain to be 8-9 out of 10 pain.  She is asking for Zofran medication, Zanaflex and Dilaudid.  She is on aspirin twice daily for DVT prophylaxis.  She was on no aspirin prior to surgery.  She is ambulating with a rolling walker.  Denies any fevers chills.  Physical exam: General well-developed well-nourished female in no acute distress mood and affect appropriate. Psych: Alert and oriented x 3 Left hip: Surgical incisions healing well no signs of infection or dehiscence.  Left hip good range of motion with internal/external rotation.  Left calf supple nontender dorsiflexion plantarflexion left ankle intact.  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  Did refill her Zofran, Zanaflex and Dilaudid.  She is weaning off of the Dilaudid and taken some Tylenol.  She will continue her aspirin 81 mg once daily for another week and then discontinue.  Follow-up with [place] in 1 month sooner if there is any questions concerns.  Staples were harvested Steri-Strips applied.

## 2023-02-20 ENCOUNTER — Telehealth: Payer: Self-pay
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
## 2023-02-21 NOTE — Telephone Encounter (Signed)
Patient aware.

## 2023-02-23 ENCOUNTER — Telehealth: Payer: Self-pay | Admitting: Orthopaedic Surgery

## 2023-02-23 MED ORDER — TIZANIDINE HCL 4 MG PO TABS: 4.0000 mg | ORAL_TABLET | Freq: Four times a day (QID) | ORAL | 0 refills | Status: DC | PRN

## 2023-02-23 NOTE — Telephone Encounter (Signed)
Patient called and ask can she get a refill of Tizanidine to the [facility] on [address]. CB#[phone]

## 2023-02-23 NOTE — Telephone Encounter (Signed)
Sent to pharmacy 

## 2023-02-23 NOTE — Addendum Note (Signed)
Addended by: Barbette Or on: 02/23/2023 03:54 PM   Modules accepted: Orders

## 2023-02-26 ENCOUNTER — Other Ambulatory Visit: Payer: Self-pay | Admitting: Orthopaedic Surgery

## 2023-02-26 MED ORDER — ONDANSETRON 4 MG PO TBDP: 4.0000 mg | ORAL_TABLET | Freq: Three times a day (TID) | ORAL | 0 refills | Status: AC | PRN

## 2023-02-26 MED ORDER — HYDROMORPHONE HCL 4 MG PO TABS: 4.0000 mg | ORAL_TABLET | Freq: Four times a day (QID) | ORAL | 0 refills | Status: AC | PRN

## 2023-02-26 NOTE — Telephone Encounter (Signed)
Pt is requesting a refill for Ondansetron for nausea & Hydromorphone for hip pain. Give pt a call at [phone]

## 2023-03-01 ENCOUNTER — Telehealth: Payer: Self-pay | Admitting: Orthopaedic Surgery

## 2023-03-01 ENCOUNTER — Ambulatory Visit: Payer: PPO | Admitting: Orthopaedic Surgery

## 2023-03-01 ENCOUNTER — Other Ambulatory Visit: Payer: Self-pay | Admitting: Orthopaedic Surgery

## 2023-03-01 ENCOUNTER — Encounter: Payer: Self-pay | Admitting: Orthopaedic Surgery

## 2023-03-01 DIAGNOSIS — Z96642 Presence of left artificial hip joint: Secondary | ICD-10-CM

## 2023-03-01 MED ORDER — DOXYCYCLINE HYCLATE 100 MG PO TABS: 100.0000 mg | ORAL_TABLET | Freq: Two times a day (BID) | ORAL | 0 refills | Status: AC

## 2023-03-01 MED ORDER — FLUCONAZOLE 150 MG PO TABS: 150.0000 mg | ORAL_TABLET | Freq: Every day | ORAL | 0 refills | Status: AC

## 2023-03-01 NOTE — Telephone Encounter (Signed)
Patient called and ask if you could call in a prescription for Dislucan. Its for yeast infection and she stated she gets those when on antibiotics. CB#[phone]
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
## 2023-03-07 ENCOUNTER — Encounter: Payer: PPO | Admitting: Orthopaedic Surgery

## 2023-03-19 ENCOUNTER — Other Ambulatory Visit: Payer: Self-pay | Admitting: Orthopaedic Surgery

## 2023-03-19 ENCOUNTER — Telehealth: Payer: Self-pay | Admitting: Orthopaedic Surgery

## 2023-03-19 MED ORDER — TIZANIDINE HCL 4 MG PO TABS: 4.0000 mg | ORAL_TABLET | Freq: Four times a day (QID) | ORAL | 0 refills | Status: DC | PRN

## 2023-03-19 NOTE — Telephone Encounter (Signed)
Patient called and wanted a refill on Hydromorphine, tezandine and anti nauseous medication. CB#[phone] and she needs it sent to [facility] pharmacy in [place], [place] [phone].

## 2023-03-20 NOTE — Telephone Encounter (Signed)
LMOM for patient of the below message from [place]  

## 2023-04-06 ENCOUNTER — Other Ambulatory Visit: Payer: Self-pay | Admitting: Orthopaedic Surgery

## 2023-04-26 ENCOUNTER — Other Ambulatory Visit: Payer: Self-pay | Admitting: Orthopaedic Surgery

## 2023-05-24 ENCOUNTER — Other Ambulatory Visit: Payer: Self-pay | Admitting: Orthopaedic Surgery

## 2023-06-15 ENCOUNTER — Other Ambulatory Visit: Payer: Self-pay | Admitting: Orthopaedic Surgery

## 2023-07-13 ENCOUNTER — Other Ambulatory Visit: Payer: Self-pay | Admitting: Orthopaedic Surgery

## 2023-08-07 ENCOUNTER — Other Ambulatory Visit: Payer: Self-pay | Admitting: Orthopaedic Surgery

## 2023-09-03 ENCOUNTER — Other Ambulatory Visit: Payer: Self-pay | Admitting: Orthopaedic Surgery

## 2023-09-24 ENCOUNTER — Other Ambulatory Visit: Payer: Self-pay | Admitting: Orthopaedic Surgery

## 2023-10-19 ENCOUNTER — Other Ambulatory Visit: Payer: Self-pay | Admitting: Orthopaedic Surgery

## 2023-11-15 ENCOUNTER — Other Ambulatory Visit: Payer: Self-pay | Admitting: Orthopaedic Surgery

## 2023-12-14 ENCOUNTER — Other Ambulatory Visit: Payer: Self-pay | Admitting: Orthopaedic Surgery

## 2024-01-07 ENCOUNTER — Other Ambulatory Visit: Payer: Self-pay | Admitting: Orthopaedic Surgery

## 2024-01-31 ENCOUNTER — Other Ambulatory Visit: Payer: Self-pay | Admitting: Orthopaedic Surgery

## 2024-02-25 ENCOUNTER — Other Ambulatory Visit: Payer: Self-pay | Admitting: Orthopaedic Surgery

## 2024-03-24 ENCOUNTER — Other Ambulatory Visit: Payer: Self-pay | Admitting: Orthopaedic Surgery

## 2024-04-16 ENCOUNTER — Other Ambulatory Visit: Payer: Self-pay | Admitting: Orthopaedic Surgery

## 2024-05-13 ENCOUNTER — Telehealth: Payer: Self-pay | Admitting: Orthopaedic Surgery

## 2024-05-13 ENCOUNTER — Other Ambulatory Visit: Payer: Self-pay | Admitting: Orthopaedic Surgery

## 2024-05-13 NOTE — Telephone Encounter (Signed)
 Pt called stating that she called in for a refill of Tizanidine  4 mg yesterday and that [facility] has faxed over a request for a refill yesterday, but she hasn't heard anything back. I made her aware that we have 24-48 hours to fill prescriptions. She is wanting to know if we received it. Pt call back number is [phone]

## 2024-06-09 ENCOUNTER — Other Ambulatory Visit: Payer: Self-pay | Admitting: Orthopaedic Surgery

## 2024-06-30 ENCOUNTER — Other Ambulatory Visit: Payer: Self-pay | Admitting: Orthopaedic Surgery

## 2024-07-31 ENCOUNTER — Other Ambulatory Visit: Payer: Self-pay | Admitting: Orthopaedic Surgery
